# Patient Record
Sex: Female | Born: 1970 | Race: Black or African American | Hispanic: No | Marital: Married | Smoking: Former smoker
Health system: Southern US, Community
[De-identification: ages and names within clinical notes are randomized; demographics above are authoritative.]

## PROBLEM LIST (undated history)

## (undated) DIAGNOSIS — I10 Essential (primary) hypertension: Secondary | ICD-10-CM

## (undated) DIAGNOSIS — N979 Female infertility, unspecified: Secondary | ICD-10-CM

## (undated) DIAGNOSIS — N971 Female infertility of tubal origin: Secondary | ICD-10-CM

## (undated) DIAGNOSIS — R011 Cardiac murmur, unspecified: Secondary | ICD-10-CM

## (undated) HISTORY — DX: Female infertility of tubal origin: N97.1

## (undated) HISTORY — DX: Essential (primary) hypertension: I10

## (undated) HISTORY — DX: Cardiac murmur, unspecified: R01.1

## (undated) HISTORY — DX: Female infertility, unspecified: N97.9

---
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
## 1997-07-05 ENCOUNTER — Other Ambulatory Visit: Admission: RE | Admit: 1997-07-05 | Discharge: 1997-07-05 | Payer: Self-pay | Admitting: *Deleted

## 2000-01-14 ENCOUNTER — Other Ambulatory Visit: Admission: RE | Admit: 2000-01-14 | Discharge: 2000-01-14 | Payer: Self-pay | Admitting: Obstetrics and Gynecology

## 2000-02-10 ENCOUNTER — Ambulatory Visit (HOSPITAL_COMMUNITY): Admission: RE | Admit: 2000-02-10 | Discharge: 2000-02-10 | Payer: Self-pay | Admitting: Obstetrics and Gynecology

## 2000-02-10 ENCOUNTER — Encounter: Payer: Self-pay | Admitting: Obstetrics and Gynecology

## 2005-02-12 ENCOUNTER — Other Ambulatory Visit: Admission: RE | Admit: 2005-02-12 | Discharge: 2005-02-12 | Payer: Self-pay | Admitting: Gynecology

## 2007-12-13 ENCOUNTER — Ambulatory Visit: Payer: Self-pay | Admitting: Women's Health

## 2007-12-27 ENCOUNTER — Ambulatory Visit: Payer: Self-pay | Admitting: Women's Health

## 2007-12-28 ENCOUNTER — Ambulatory Visit: Payer: Self-pay | Admitting: Gynecology

## 2008-01-11 ENCOUNTER — Ambulatory Visit: Payer: Self-pay | Admitting: Gynecology

## 2008-01-16 ENCOUNTER — Encounter (INDEPENDENT_AMBULATORY_CARE_PROVIDER_SITE_OTHER): Payer: Self-pay | Admitting: Emergency Medicine

## 2008-01-16 ENCOUNTER — Emergency Department (HOSPITAL_COMMUNITY): Admission: EM | Admit: 2008-01-16 | Discharge: 2008-01-16 | Payer: Self-pay | Admitting: Emergency Medicine

## 2008-01-19 ENCOUNTER — Ambulatory Visit: Payer: Self-pay | Admitting: Gynecology

## 2008-01-26 ENCOUNTER — Ambulatory Visit: Payer: Self-pay | Admitting: Gynecology

## 2008-01-27 ENCOUNTER — Encounter: Payer: Self-pay | Admitting: Gynecology

## 2008-01-27 ENCOUNTER — Ambulatory Visit: Payer: Self-pay | Admitting: Gynecology

## 2008-01-27 ENCOUNTER — Ambulatory Visit (HOSPITAL_BASED_OUTPATIENT_CLINIC_OR_DEPARTMENT_OTHER): Admission: RE | Admit: 2008-01-27 | Discharge: 2008-01-27 | Payer: Self-pay | Admitting: Gynecology

## 2008-01-27 HISTORY — PX: DILATION AND EVACUATION: SHX1459

## 2008-02-03 ENCOUNTER — Ambulatory Visit: Payer: Self-pay | Admitting: Gynecology

## 2008-02-11 ENCOUNTER — Ambulatory Visit: Payer: Self-pay | Admitting: Gynecology

## 2008-02-22 ENCOUNTER — Ambulatory Visit: Payer: Self-pay | Admitting: Gynecology

## 2008-02-29 ENCOUNTER — Ambulatory Visit: Payer: Self-pay | Admitting: Gynecology

## 2008-02-29 ENCOUNTER — Other Ambulatory Visit: Admission: RE | Admit: 2008-02-29 | Discharge: 2008-02-29 | Payer: Self-pay | Admitting: Gynecology

## 2008-02-29 ENCOUNTER — Encounter: Payer: Self-pay | Admitting: Gynecology

## 2008-05-09 ENCOUNTER — Ambulatory Visit: Payer: Self-pay | Admitting: Gynecology

## 2008-05-18 ENCOUNTER — Ambulatory Visit: Payer: Self-pay | Admitting: Gynecology

## 2008-05-23 ENCOUNTER — Ambulatory Visit: Payer: Self-pay | Admitting: Gynecology

## 2008-06-16 ENCOUNTER — Ambulatory Visit: Payer: Self-pay | Admitting: Gynecology

## 2008-06-29 ENCOUNTER — Ambulatory Visit: Payer: Self-pay | Admitting: Gynecology

## 2009-11-21 ENCOUNTER — Ambulatory Visit: Payer: Self-pay | Admitting: Gynecology

## 2009-11-21 ENCOUNTER — Other Ambulatory Visit: Admission: RE | Admit: 2009-11-21 | Discharge: 2009-11-21 | Payer: Self-pay | Admitting: Gynecology

## 2009-12-14 ENCOUNTER — Ambulatory Visit: Payer: Self-pay | Admitting: Gynecology

## 2009-12-20 ENCOUNTER — Ambulatory Visit: Payer: Self-pay | Admitting: Gynecology

## 2010-07-09 NOTE — Op Note (Signed)
[name], [name]                ACCOUNT NO.:  [id number]   MEDICAL RECORD NO.:  [id number]          PATIENT TYPE:  AMB   LOCATION:  [facility]                         FACILITY:  [facility]   PHYSICIAN:  Juan H. Lily Peer, M.D.DATE OF BIRTH:  1970/12/12   DATE OF PROCEDURE:  01/27/2008  DATE OF DISCHARGE:                               OPERATIVE REPORT   INDICATIONS FOR OPERATION:  A 40 year old gravida 4, para 1, AB 2 with  incomplete AB and retained products of conception.  The patient had been  seen in the emergency room and has spontaneously passed where she felt  was the fetus and continued to bleed, presented to the office last week  and brought part of the specimen that she had passed in the toilet, and  was submitted for histological evaluation.  The report came back  hydropic chorionic villi identified features consistent with an  incomplete mole so the patient has been taken to the operating room for  complete evacuation of the uterine contents.   PREOPERATIVE DIAGNOSES:  1. Incomplete abortion.  2. Retained products of conception.  3. Incomplete mole.   POSTOPERATIVE DIAGNOSIS:  1. Incomplete abortion.  2. Retained products of conception.  3. Incomplete mole.   ANESTHESIA:  General endotracheal anesthesia.   PROCEDURE PERFORMED:  Dilatation and evacuation.   DESCRIPTION OF OPERATION:  After the patient was adequately counseled,  she was taken to the operating room where she underwent successful  general endotracheal anesthesia.  She had received a gram of cefoxitin  IV for prophylaxis.  She was placed in the low lithotomy position.  The  vagina and perineum were prepped and draped in usual sterile fashion.  A  laminaria that had been placed day before intracervically was removed.  The uterus sounded to approximately 10 cm, did not require any cervical  dilatation.  A 10 mm suction curette was introduced into the  intrauterine cavity after the single-tooth tenaculum was  placed on the  anterior cervical lip.  Prior to this, bladder had been evacuate with  contents with a [employer] Robinson for approximately 50 cc.  The  suction curette was introduced into the intrauterine cavity to remove  the remaining products of conception.  This was followed by a Hunter  curette to completely evacuate the uterine cavity, and the specimen was  submitted for histological  evaluation.  Pitocin 10 units were given in a liter of LR as a  uterotonic agent.  She clamped down very nicely.  She was extubated,  transferred to recovery room with stable vital signs after the single-  tooth tenaculum was removed.  Her blood type is A+.  Blood loss from  procedure was minimal.  IV fluids consisted 700 cc of lactated Ringer's.      Juan H. Lily Peer, M.D.  Electronically Signed     JHF/MEDQ  D:  01/27/2008  T:  01/28/2008  Job:  [id number]

## 2010-07-09 NOTE — H&P (Signed)
[name], [name]                ACCOUNT NO.:  [id number]   MEDICAL RECORD NO.:  [id number]          PATIENT TYPE:  AMB   LOCATION:  [facility]                         FACILITY:  [facility]   PHYSICIAN:  [name], M.D.DATE OF BIRTH:  01-09-1971   DATE OF ADMISSION:  DATE OF DISCHARGE:                              HISTORY & PHYSICAL   The patient was scheduled for surgery on Thursday, December 3 at 1 p.m.  at [facility].  Please have his history and physical  available.   CHIEF COMPLAINT:  1. Retained products of conception.  2. Incomplete mole.   HISTORY:  The patient is a 40 year old gravida 4, para 1 AB 2 who had  been seen in the office on November 2 where she was noted to have a  viable intrauterine pregnancy consistent with 7 weeks' gestation.  She  had had some issues with passing some blood clots which essentially  resolved, but she was tested and her blood type was A+.  She is given  threatened AB precautions.  She returned back on November 17, fetal  viability was noted and size was consistent with dates at approximately  9 weeks' gestation.  She returned back to the office on November 25  stating that she had been to Emergency Room at [facility] on  Sunday, November 22.  She was complaining of bleeding and cramping and  while waiting in the exam room, she had passed what she describes as the  fetus and was asked to follow up in the office which she did on November  25.  We tried to obtain records from [facility] and reviewed the  notes and there was description of the fetus, but pathology report, they  received no specimen.  When the patient saw me on November 25, she had  in a plastic bag what appeared to be placental like tissue and was  submitted for histological evaluation, and the report came back hydropic  chorionic villi identified.  The features may be seen an _incomplete  mole_________and in her case, there was a viable fetus before  confirmed  by ultrasound.  This would be consistent with an incomplete mole.  She  returned back to the office today for a followup ultrasound to see if  the tissue had passed and to discuss this pathology report.  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  Her quantitative beta HCG today was 558.  Her TSH was normal.  Her  urinalysis; rare wbc; 1+ bacteria.  Her CBC had a hemoglobin 11.9,  hematocrit 37.3 and platelet count 523,000.  The patient had a laminaria  placed intracervically today and _is_________scheduled to undergo D&E  tomorrow, December 3.   PAST MEDICAL HISTORY:  The patient denies any allergy.  She has had 2  elective terminations in the past.  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  She has  history of murmurs, which sounds to me like she is having history of  mitral valve prolapse, was instructed always to receive antibiotics  which she will receive with this surgical procedure.  She was just on  multivitamin and had a cholecystectomy in 1992.   FAMILY HISTORY:  Maternal grandmother with breast cancer.   PHYSICAL EXAMINATION:  GENERAL:  The patient weighs approximately 246  pounds.  HEENT:  Unremarkable.  NECK: Supple.  Trachea midline.  No carotid bruits or thyromegaly.  LUNGS:  Clear to auscultation without any rhonchi or wheezes.  HEART:  Regular rate and rhythm.  No murmurs or gallop.  BREAST:  Exam not done.  ABDOMEN:  Soft and nontender.  No rebound or guarding.  PELVIC:  No vaginal discharge or blood was noted in the vault.  Uterus  approximately 6 to 7 weeks' size.  No palpable adnexal masses.  Laminaria was in placed intracervically to facilitate during the  D&E,  possible resectoscopic procedure tomorrow.   ASSESSMENT:  The patient is a 40 year old gravida 4, para 1, AB 2, now  AB 3 with incomplete AB and products of conception that were passed with  significant hydropic chorionic villi, ultrasound-confirmed retained  products of conception.  The patient will be taken to the operating room  tomorrow at [facility] to undergo a dilation and  evacuation.  The laminaria was placed intracervically.  The risks,  benefits and pros and cons of the procedure were discussed with the  patient to include infection although she will receive prophylaxis  antibiotic as well because of her history of mitral valve prolapse, also  the risk of perforation, hemorrhage in the event that she would need  blood or blood transfusion.  She is fully aware of the potential risk of  anaphylactic reactions, hepatitis, and AIDS.  All these issues were  discussed with the patient and will follow her quantitative beta HCGs  postop and I have given her literature information on molar pregnancy  for her to read as well and her blood type was A+.   PLAN:  As per assessment above.      [name], M.D.  Electronically Signed     JHF/MEDQ  D:  01/26/2008  T:  01/26/2008  Job:  [id number]

## 2010-11-27 LAB — URINALYSIS, ROUTINE W REFLEX MICROSCOPIC
Glucose, UA: NEGATIVE
Hgb urine dipstick: NEGATIVE
Ketones, ur: 40 — AB
Protein, ur: NEGATIVE
pH: 5.5

## 2010-11-27 LAB — CBC
HCT: 38.4
Hemoglobin: 12.6
MCHC: 32.9
MCV: 76.3 — ABNORMAL LOW
Platelets: 320
RBC: 5.03
RDW: 16.8 — ABNORMAL HIGH
WBC: 8.9

## 2010-11-27 LAB — HCG, QUANTITATIVE, PREGNANCY: hCG, Beta Chain, Quant, S: 133438 — ABNORMAL HIGH

## 2011-03-27 ENCOUNTER — Other Ambulatory Visit (HOSPITAL_COMMUNITY)
Admission: RE | Admit: 2011-03-27 | Discharge: 2011-03-27 | Disposition: A | Discharge status: Home / Self Care | Payer: BC Managed Care – PPO | Source: Ambulatory Visit | Attending: Gynecology | Admitting: Gynecology

## 2011-03-27 ENCOUNTER — Encounter: Payer: Self-pay | Admitting: Gynecology

## 2011-03-27 ENCOUNTER — Ambulatory Visit (INDEPENDENT_AMBULATORY_CARE_PROVIDER_SITE_OTHER): Payer: BC Managed Care – PPO | Admitting: Gynecology

## 2011-03-27 DIAGNOSIS — I10 Essential (primary) hypertension: Secondary | ICD-10-CM | POA: Insufficient documentation

## 2011-03-27 DIAGNOSIS — B9689 Other specified bacterial agents as the cause of diseases classified elsewhere: Secondary | ICD-10-CM

## 2011-03-27 DIAGNOSIS — N760 Acute vaginitis: Secondary | ICD-10-CM

## 2011-03-27 DIAGNOSIS — Z113 Encounter for screening for infections with a predominantly sexual mode of transmission: Secondary | ICD-10-CM

## 2011-03-27 DIAGNOSIS — E663 Overweight: Secondary | ICD-10-CM

## 2011-03-27 DIAGNOSIS — A499 Bacterial infection, unspecified: Secondary | ICD-10-CM

## 2011-03-27 DIAGNOSIS — Z01419 Encounter for gynecological examination (general) (routine) without abnormal findings: Secondary | ICD-10-CM | POA: Insufficient documentation

## 2011-03-27 DIAGNOSIS — Z833 Family history of diabetes mellitus: Secondary | ICD-10-CM

## 2011-03-27 LAB — CBC WITH DIFFERENTIAL/PLATELET
Eosinophils Absolute: 0.1 10*3/uL (ref 0.0–0.7)
Eosinophils Relative: 1 % (ref 0–5)
HCT: 38.8 % (ref 36.0–46.0)
Hemoglobin: 12.5 g/dL (ref 12.0–15.0)
Lymphocytes Relative: 33 % (ref 12–46)
Lymphs Abs: 2.8 10*3/uL (ref 0.7–4.0)
MCH: 24.2 pg — ABNORMAL LOW (ref 26.0–34.0)
MCV: 75 fL — ABNORMAL LOW (ref 78.0–100.0)
Monocytes Absolute: 0.6 10*3/uL (ref 0.1–1.0)
Monocytes Relative: 7 % (ref 3–12)
RBC: 5.17 MIL/uL — ABNORMAL HIGH (ref 3.87–5.11)
WBC: 8.4 10*3/uL (ref 4.0–10.5)

## 2011-03-27 LAB — WET PREP FOR TRICH, YEAST, CLUE
Trich, Wet Prep: NONE SEEN
WBC, Wet Prep HPF POC: NONE SEEN
Yeast Wet Prep HPF POC: NONE SEEN

## 2011-03-27 LAB — URINALYSIS W MICROSCOPIC + REFLEX CULTURE
Bilirubin Urine: NEGATIVE
Glucose, UA: NEGATIVE mg/dL
Hgb urine dipstick: NEGATIVE
Ketones, ur: NEGATIVE mg/dL
Leukocytes, UA: NEGATIVE
Nitrite: NEGATIVE
Protein, ur: NEGATIVE mg/dL
Specific Gravity, Urine: 1.025 (ref 1.005–1.030)
Urobilinogen, UA: 1 mg/dL (ref 0.0–1.0)
pH: 5.5 (ref 5.0–8.0)

## 2011-03-27 LAB — HEPATITIS B SURFACE ANTIGEN: Hepatitis B Surface Ag: NEGATIVE

## 2011-03-27 LAB — HEPATITIS C ANTIBODY: HCV Ab: NEGATIVE

## 2011-03-27 LAB — CHOLESTEROL, TOTAL: Cholesterol: 169 mg/dL (ref 0–200)

## 2011-03-27 LAB — HIV ANTIBODY (ROUTINE TESTING W REFLEX): HIV: NONREACTIVE

## 2011-03-27 MED ORDER — METRONIDAZOLE 500 MG PO TABS: 500.0000 mg | ORAL_TABLET | Freq: Two times a day (BID) | ORAL | Status: AC

## 2011-03-27 NOTE — Patient Instructions (Addendum)
Please remember to get a blood pressure reading over the course of the next 10 days and fax it to me to look at. Cut down on salt intake. The following are some tips on diet and exercise.                                                   Cholesterol Control Diet  Cholesterol levels in your body are determined significantly by your diet. Cholesterol levels may also be related to heart disease. The following material helps to explain this relationship and discusses what you can do to help keep your heart healthy. Not all cholesterol is bad. Low-density lipoprotein (LDL) cholesterol is the "bad" cholesterol. It may cause fatty deposits to build up inside your arteries. High-density lipoprotein (HDL) cholesterol is "good." It helps to remove the "bad" LDL cholesterol from your blood. Cholesterol is a very important risk factor for heart disease. Other risk factors are high blood pressure, smoking, stress, heredity, and weight. The heart muscle gets its supply of blood through the coronary arteries. If your LDL cholesterol is high and your HDL cholesterol is low, you are at risk for having fatty deposits build up in your coronary arteries. This leaves less room through which blood can flow. Without sufficient blood and oxygen, the heart muscle cannot function properly and you may feel chest pains (angina pectoris). When a coronary artery closes up entirely, a part of the heart muscle may die, causing a heart attack (myocardial infarction). CHECKING CHOLESTEROL When your caregiver sends your blood to a lab to be analyzed for cholesterol, a complete lipid (fat) profile may be done. With this test, the total amount of cholesterol and levels of LDL and HDL are determined. Triglycerides are a type of fat that circulates in the blood and can also be used to determine heart disease risk. The list below describes what the numbers should be: Test: Total Cholesterol.  Less than 200 mg/dl.  Test: LDL "bad  cholesterol."  Less than 100 mg/dl.   Less than 70 mg/dl if you are at very high risk of a heart attack or sudden cardiac death.  Test: HDL "good cholesterol."  Greater than 50 mg/dl for women.   Greater than 40 mg/dl for men.  Test: Triglycerides.  Less than 150 mg/dl.  CONTROLLING CHOLESTEROL WITH DIET Although exercise and lifestyle factors are important, your diet is key. That is because certain foods are known to raise cholesterol and others to lower it. The goal is to balance foods for their effect on cholesterol and more importantly, to replace saturated and trans fat with other types of fat, such as monounsaturated fat, polyunsaturated fat, and omega-3 fatty acids. On average, a person should consume no more than 15 to 17 g of saturated fat daily. Saturated and trans fats are considered "bad" fats, and they will raise LDL cholesterol. Saturated fats are primarily found in animal products such as meats, butter, and cream. However, that does not mean you need to sacrifice all your favorite foods. Today, there are good tasting, low-fat, low-cholesterol substitutes for most of the things you like to eat. Choose low-fat or nonfat alternatives. Choose round or loin cuts of red meat, since these types of cuts are lowest in fat and cholesterol. Chicken (without the skin), fish, veal, and ground [place] breast are excellent choices. Eliminate fatty  meats, such as hot dogs and salami. Even shellfish have little or no saturated fat. Have a 3 oz (85 g) portion when you eat lean meat, poultry, or fish. Trans fats are also called "partially hydrogenated oils." They are oils that have been scientifically manipulated so that they are solid at room temperature resulting in a longer shelf life and improved taste and texture of foods in which they are added. Trans fats are found in stick margarine, some tub margarines, cookies, crackers, and baked goods.  When baking and cooking, oils are an excellent  substitute for butter. The monounsaturated oils are especially beneficial since it is believed they lower LDL and raise HDL. The oils you should avoid entirely are saturated tropical oils, such as coconut and palm.  Remember to eat liberally from food groups that are naturally free of saturated and trans fat, including fish, fruit, vegetables, beans, grains (barley, rice, couscous, bulgur wheat), and pasta (without cream sauces).  IDENTIFYING FOODS THAT LOWER CHOLESTEROL  Soluble fiber may lower your cholesterol. This type of fiber is found in fruits such as apples, vegetables such as broccoli, potatoes, and carrots, legumes such as beans, peas, and lentils, and grains such as barley. Foods fortified with plant sterols (phytosterol) may also lower cholesterol. You should eat at least 2 g per day of these foods for a cholesterol lowering effect.  Read package labels to identify low-saturated fats, trans fats free, and low-fat foods at the supermarket. Select cheeses that have only 2 to 3 g saturated fat per ounce. Use a heart-healthy tub margarine that is free of trans fats or partially hydrogenated oil. When buying baked goods (cookies, crackers), avoid partially hydrogenated oils. Breads and muffins should be made from whole grains (whole-wheat or whole oat flour, instead of "flour" or "enriched flour"). Buy non-creamy canned soups with reduced salt and no added fats.  FOOD PREPARATION TECHNIQUES  Never deep-fry. If you must fry, either stir-fry, which uses very little fat, or use non-stick cooking sprays. When possible, broil, bake, or roast meats, and steam vegetables. Instead of dressing vegetables with butter or margarine, use lemon and herbs, applesauce and cinnamon (for squash and sweet potatoes), nonfat yogurt, salsa, and low-fat dressings for salads.  LOW-SATURATED FAT / LOW-FAT FOOD SUBSTITUTES Meats / Saturated Fat (g)  Avoid: Steak, marbled (3 oz/85 g) / 11 g   Choose: Steak, lean (3 oz/85 g)  / 4 g   Avoid: Hamburger (3 oz/85 g) / 7 g   Choose: Hamburger, lean (3 oz/85 g) / 5 g   Avoid: Ham (3 oz/85 g) / 6 g   Choose: Ham, lean cut (3 oz/85 g) / 2.4 g   Avoid: Chicken, with skin, dark meat (3 oz/85 g) / 4 g   Choose: Chicken, skin removed, dark meat (3 oz/85 g) / 2 g   Avoid: Chicken, with skin, light meat (3 oz/85 g) / 2.5 g   Choose: Chicken, skin removed, light meat (3 oz/85 g) / 1 g  Dairy / Saturated Fat (g)  Avoid: Whole milk (1 cup) / 5 g   Choose: Low-fat milk, 2% (1 cup) / 3 g   Choose: Low-fat milk, 1% (1 cup) / 1.5 g   Choose: Skim milk (1 cup) / 0.3 g   Avoid: Hard cheese (1 oz/28 g) / 6 g   Choose: Skim milk cheese (1 oz/28 g) / 2 to 3 g   Avoid: Cottage cheese, 4% fat (1 cup) / 6.5 g  Choose: Low-fat cottage cheese, 1% fat (1 cup) / 1.5 g   Avoid: Ice cream (1 cup) / 9 g   Choose: Sherbet (1 cup) / 2.5 g   Choose: Nonfat frozen yogurt (1 cup) / 0.3 g   Choose: Frozen fruit bar / trace   Avoid: Whipped cream (1 tbs) / 3.5 g   Choose: Nondairy whipped topping (1 tbs) / 1 g  Condiments / Saturated Fat (g)  Avoid: Mayonnaise (1 tbs) / 2 g   Choose: Low-fat mayonnaise (1 tbs) / 1 g   Avoid: Butter (1 tbs) / 7 g   Choose: Extra light margarine (1 tbs) / 1 g   Avoid: Coconut oil (1 tbs) / 11.8 g   Choose: Olive oil (1 tbs) / 1.8 g   Choose: Corn oil (1 tbs) / 1.7 g   Choose: Safflower oil (1 tbs) / 1.2 g   Choose: Sunflower oil (1 tbs) / 1.4 g   Choose: Soybean oil (1 tbs) / 2.4 g   Choose: Canola oil (1 tbs) / 1 g  Document Released: 02/10/2005 Document Revised: 10/23/2010 Document Reviewed: 08/01/2010 [facility] Patient Information 2012 [place], [place].  Exercise to Lose Weight Exercise and a healthy diet may help you lose weight. Your doctor may suggest specific exercises. EXERCISE IDEAS AND TIPS  Choose low-cost things you enjoy doing, such as walking, bicycling, or exercising to workout videos.   Take stairs instead  of the elevator.   Walk during your lunch break.   Park your car further away from work or school.   Go to a gym or an exercise class.   Start with 5 to 10 minutes of exercise each day. Build up to 30 minutes of exercise 4 to 6 days a week.   Wear shoes with good support and comfortable clothes.   Stretch before and after working out.   Work out until you breathe harder and your heart beats faster.   Drink extra water when you exercise.   Do not do so much that you hurt yourself, feel dizzy, or get very short of breath.  Exercises that burn about 150 calories:  Running 1  miles in 15 minutes.   Playing volleyball for 45 to 60 minutes.   Washing and waxing a car for 45 to 60 minutes.   Playing touch football for 45 minutes.   Walking 1  miles in 35 minutes.   Pushing a stroller 1  miles in 30 minutes.   Playing basketball for 30 minutes.   Raking leaves for 30 minutes.   Bicycling 5 miles in 30 minutes.   Walking 2 miles in 30 minutes.   Dancing for 30 minutes.   Shoveling snow for 15 minutes.   Swimming laps for 20 minutes.   Walking up stairs for 15 minutes.   Bicycling 4 miles in 15 minutes.   Gardening for 30 to 45 minutes.   Jumping rope for 15 minutes.   Washing windows or floors for 45 to 60 minutes.  Document Released: 03/15/2010 Document Revised: 10/23/2010 Document Reviewed: 03/15/2010 [facility] Patient Information 2012 [place], [place].     Bacterial Vaginosis Bacterial vaginosis (BV) is a vaginal infection where the normal balance of bacteria in the vagina is disrupted. The normal balance is then replaced by an overgrowth of certain bacteria. There are several different kinds of bacteria that can cause BV. BV is the most common vaginal infection in women of childbearing age. CAUSES   The cause of BV is  not fully understood. BV develops when there is an increase or imbalance of harmful bacteria.   Some activities or behaviors can upset the  normal balance of bacteria in the vagina and put women at increased risk including:   Having a new sex partner or multiple sex partners.   Douching.   Using an intrauterine device (IUD) for contraception.   It is not clear what role sexual activity plays in the development of BV. However, women that have never had sexual intercourse are rarely infected with BV.  Women do not get BV from toilet seats, bedding, swimming pools or from touching objects around them.  SYMPTOMS   Grey vaginal discharge.   A fish-like odor with discharge, especially after sexual intercourse.   Itching or burning of the vagina and vulva.   Burning or pain with urination.   Some women have no signs or symptoms at all.  DIAGNOSIS  Your caregiver must examine the vagina for signs of BV. Your caregiver will perform lab tests and look at the sample of vaginal fluid through a microscope. They will look for bacteria and abnormal cells (clue cells), a pH test higher than 4.5, and a positive amine test all associated with BV.  RISKS AND COMPLICATIONS   Pelvic inflammatory disease (PID).   Infections following gynecology surgery.   Developing HIV.   Developing herpes virus.  TREATMENT  Sometimes BV will clear up without treatment. However, all women with symptoms of BV should be treated to avoid complications, especially if gynecology surgery is planned. Female partners generally do not need to be treated. However, BV may spread between female sex partners so treatment is helpful in preventing a recurrence of BV.   BV may be treated with antibiotics. The antibiotics come in either pill or vaginal cream forms. Either can be used with nonpregnant or pregnant women, but the recommended dosages differ. These antibiotics are not harmful to the baby.   BV can recur after treatment. If this happens, a second round of antibiotics will often be prescribed.   Treatment is important for pregnant women. If not treated, BV can  cause a premature delivery, especially for a pregnant woman who had a premature birth in the past. All pregnant women who have symptoms of BV should be checked and treated.   For chronic reoccurrence of BV, treatment with a type of prescribed gel vaginally twice a week is helpful.  HOME CARE INSTRUCTIONS   Finish all medication as directed by your caregiver.   Do not have sex until treatment is completed.   Tell your sexual partner that you have a vaginal infection. They should see their caregiver and be treated if they have problems, such as a mild rash or itching.   Practice safe sex. Use condoms. Only have 1 sex partner.  PREVENTION  Basic prevention steps can help reduce the risk of upsetting the natural balance of bacteria in the vagina and developing BV:  Do not have sexual intercourse (be abstinent).   Do not douche.   Use all of the medicine prescribed for treatment of BV, even if the signs and symptoms go away.   Tell your sex partner if you have BV. That way, they can be treated, if needed, to prevent reoccurrence.  SEEK MEDICAL CARE IF:   Your symptoms are not improving after 3 days of treatment.   You have increased discharge, pain, or fever.  MAKE SURE YOU:   Understand these instructions.   Will watch your condition.  Will get help right away if you are not doing well or get worse.  FOR MORE INFORMATION  Division of STD Prevention (DSTDP), Centers for Disease Control and Prevention: [employer] American Social Health Association (ASHA): www.ashastd.org  Document Released: 02/10/2005 Document Revised: 10/23/2010 Document Reviewed: 08/03/2008 [facility] Patient Information 2012 [place], [place].

## 2011-03-27 NOTE — Progress Notes (Signed)
[name] May 26, 1970 [id number]   History:    41 y.o.  for annual exam and has not been seen the office since 2011. Patient interested in getting an STD screen. Review of her record indicated she had history of hypertension the past and had been on HCTZ 25 mg daily and is no longer on it. Today's blood pressure 132/88. Patient has not been sexually active in 2 months. She states her cycles are regular every 28 days and not using any form of contraception. Review of her record and indicated that in 2011 her hemoglobin A1c was elevated and a followup fasting blood sugar was in the normal range. She is overweight with a BMI of 46.17. Patient denies any prior history of abnormal Pap smears  Past medical history,surgical history, family history and social history were all reviewed and documented in the EPIC chart.  Gynecologic History Patient's last menstrual period was 03/12/2011. Contraception: none Last Pap: 2011. Results were: normal Last mammogram: No prior study. Results were: No prior study  Obstetric History OB History    Grav Para Term Preterm Abortions TAB SAB Ect Mult Living   2 1 1  1  1   1      # Outc Date GA Lbr Len/2nd Wgt Sex Del Anes PTL Lv   1 SAB            2 TRM      SVD  No Yes       ROS:  Was performed and pertinent positives and negatives are included in the history.  Exam: chaperone present  BP 132/88  Ht 5\' 4"  (1.626 m)  Wt 269 lb (122.018 kg)  BMI 46.17 kg/m2  LMP 03/12/2011  Body mass index is 46.17 kg/(m^2).  General appearance : Well developed well nourished female. No acute distress HEENT: Neck supple, trachea midline, no carotid bruits, no thyroidmegaly Lungs: Clear to auscultation, no rhonchi or wheezes, or rib retractions  Heart: Regular rate and rhythm, no murmurs or gallops Breast:Examined in sitting and supine position were symmetrical in appearance, no palpable masses or tenderness,  no skin retraction, no nipple inversion, no nipple  discharge, no skin discoloration, no axillary or supraclavicular lymphadenopathy Abdomen: no palpable masses or tenderness, no rebound or guarding Extremities: no edema or skin discoloration or tenderness  Pelvic:  Bartholin, Urethra, Skene Glands: Within normal limits             Vagina: No gross lesions or discharge  Cervix: No gross lesions or discharge  Uterus  anteverted, normal size, shape and consistency, non-tender and mobile  Adnexa  Without masses or tenderness  Anus and perineum  normal   Rectovaginal  normal sphincter tone without palpated masses or tenderness             Hemoccult not done     Assessment/Plan:  41 y.o. female for annual exam unremarkable. Patient requesting an STD screen. Wet prep demonstrated clue cells and she'll be treated for her BV with Flagyl 500 mg one by mouth twice a day for 5 days. GC and Chlamydia culture as well as a hepatitis B and C. HIV and RPR obtained results pending at time of this dictation. Pap smear was done today. Screening guidelines discussed. Pap smear will be done today the next will be in 3 years. Literature information on weight reduction diet and exercise was provided. She was instructed to do her monthly self breast examination. We'll see her back in one year or when necessary.  Ok Edwards MD, 12:28 PM 03/27/2011

## 2011-03-28 LAB — RPR

## 2011-03-28 LAB — GC/CHLAMYDIA PROBE AMP, GENITAL: Chlamydia, DNA Probe: NEGATIVE

## 2011-07-24 ENCOUNTER — Institutional Professional Consult (permissible substitution): Payer: BC Managed Care – PPO | Admitting: Gynecology

## 2011-08-26 ENCOUNTER — Ambulatory Visit (INDEPENDENT_AMBULATORY_CARE_PROVIDER_SITE_OTHER): Payer: BC Managed Care – PPO | Admitting: Physician Assistant

## 2011-08-26 ENCOUNTER — Encounter: Payer: Self-pay | Admitting: Gynecology

## 2011-08-26 ENCOUNTER — Ambulatory Visit (INDEPENDENT_AMBULATORY_CARE_PROVIDER_SITE_OTHER): Payer: BC Managed Care – PPO | Admitting: Gynecology

## 2011-08-26 VITALS — BP 130/90

## 2011-08-26 VITALS — BP 134/92 | HR 71 | Temp 98.5°F | Resp 18 | Ht 65.75 in | Wt 291.0 lb

## 2011-08-26 DIAGNOSIS — E669 Obesity, unspecified: Secondary | ICD-10-CM

## 2011-08-26 DIAGNOSIS — N979 Female infertility, unspecified: Secondary | ICD-10-CM

## 2011-08-26 DIAGNOSIS — I10 Essential (primary) hypertension: Secondary | ICD-10-CM

## 2011-08-26 DIAGNOSIS — Z0000 Encounter for general adult medical examination without abnormal findings: Secondary | ICD-10-CM

## 2011-08-26 DIAGNOSIS — Z111 Encounter for screening for respiratory tuberculosis: Secondary | ICD-10-CM

## 2011-08-26 LAB — POCT URINALYSIS DIPSTICK
Ketones, UA: NEGATIVE
Protein, UA: NEGATIVE
Spec Grav, UA: 1.02
pH, UA: 6

## 2011-08-26 LAB — POCT UA - MICROSCOPIC ONLY
Casts, Ur, LPF, POC: NEGATIVE
Crystals, Ur, HPF, POC: NEGATIVE

## 2011-08-26 LAB — COMPREHENSIVE METABOLIC PANEL
CO2: 25 mEq/L (ref 19–32)
Creat: 0.91 mg/dL (ref 0.50–1.10)
Glucose, Bld: 96 mg/dL (ref 70–99)
Total Bilirubin: 0.3 mg/dL (ref 0.3–1.2)

## 2011-08-26 LAB — POCT CBC
Granulocyte percent: 56.9 %G (ref 37–80)
HCT, POC: 41.4 % (ref 37.7–47.9)
MCH, POC: 23.6 pg — AB (ref 27–31.2)
MCV: 77.6 fL — AB (ref 80–97)
POC LYMPH PERCENT: 38.3 %L (ref 10–50)
RDW, POC: 16.6 %
WBC: 7.2 10*3/uL (ref 4.6–10.2)

## 2011-08-26 LAB — LIPID PANEL
Cholesterol: 183 mg/dL (ref 0–200)
HDL: 41 mg/dL (ref >39)
Total CHOL/HDL Ratio: 4.5 Ratio
Triglycerides: 71 mg/dL (ref <150)
VLDL: 14 mg/dL (ref 0–40)

## 2011-08-26 MED ORDER — DOXYCYCLINE HYCLATE 50 MG PO CAPS: 100.0000 mg | ORAL_CAPSULE | Freq: Two times a day (BID) | ORAL | Status: AC

## 2011-08-26 MED ORDER — HYDROCHLOROTHIAZIDE 12.5 MG PO CAPS: 12.5000 mg | ORAL_CAPSULE | ORAL | Status: DC

## 2011-08-26 NOTE — Patient Instructions (Addendum)
Infertility WHAT IS INFERTILITY?  Infertility is usually defined as not being able to get pregnant after trying for one year of regular sexual intercourse without the use of contraceptives. Or not being able to carry a pregnancy to term and have a baby. The infertility rate in the [place] is around 10%. Pregnancy is the result of a chain of events. A woman must release an egg from one of her ovaries (ovulation). The egg must be fertilized by the female sperm. Then it travels through a fallopian tube into the uterus (womb), where it attaches to the wall of the uterus and grows. A man must have enough sperm, and the sperm must join with (fertilize) the egg along the way, at the proper time. The fertilized egg must then become attached to the inside of the uterus. While this may seem simple, many things can happen to prevent pregnancy from occurring.  WHOSE PROBLEM IS IT?  About 20% of infertility cases are due to problems with the man (female factors) and 65% are due to problems with the woman (female factors). Other cases are due to a combination of female and female factors or to unknown causes.  WHAT CAUSES INFERTILITY IN MEN?  Infertility in men is often caused by problems with making enough normal sperm or getting the sperm to reach the egg. Problems with sperm may exist from birth or develop later in life, due to illness or injury. Some men produce no sperm, or produce too few sperm (oligospermia). Other problems include:  Sexual dysfunction.   Hormonal or endocrine problems.   Age. Female fertility decreases with age, but not at as young an age as female fertility.   Infection.   Congenital problems. Birth defect, such as absence of the tubes that carry the sperm (vas deferens).   Genetic/chromosomal problems.   Antisperm antibody problems.   Retrograde ejaculation (sperm go into the bladder).   Varicoceles, spematoceles, or tumors of the testicles.   Lifestyle can influence the number  and quality of a man's sperm.   Alcohol and drugs can temporarily reduce sperm quality.   Environmental toxins, including pesticides and lead, may cause some cases of infertility in men.  WHAT CAUSES INFERTILITY IN WOMEN?   Problems with ovulation account for most infertility in women. Without ovulation, eggs are not available to be fertilized.   Signs of problems with ovulation include irregular menstrual periods or no periods at all.   Simple lifestyle factors, including stress, diet, or athletic training, can affect a woman's hormonal balance.   Age. Fertility begins to decrease in women in the early 30s and is worse after age 37.   Much less often, a hormonal imbalance from a serious medical problem, such as a pituitary gland tumor, thyroid or other chronic medical disease, can cause ovulation problems.   Pelvic infections.   Polycystic ovary syndrome (increase in female hormones, unable to ovulate).   Alcohol or illegal drugs.   Environmental toxins, radiation, pesticides, and certain chemicals.   Aging is an important factor in female infertility.   The ability of a woman's ovaries to produce eggs declines with age, especially after age 35. About one third of couples where the woman is over 35 will have problems with fertility.   By the time she reaches menopause when her monthly periods stop for good, a woman can no longer produce eggs or become pregnant.   Other problems can also lead to infertility in women. If the fallopian tubes are blocked   at one or both ends, the egg cannot travel through the tubes into the uterus. Scar tissue (adhesions) in the pelvis may cause blocked tubes. This may result from pelvic inflammatory disease, endometriosis, or surgery for an ectopic pregnancy (fertilized egg implanted outside the uterus) or any pelvic or abdominal surgery causing adhesions.   Fibroid tumors or polyps of the uterus.   Congenital (birth defect) abnormalities of the uterus.    Infection of the cervix (cervicitis).   Cervical stenosis (narrowing).   Abnormal cervical mucus.   Polycystic ovary syndrome.   Having sexual intercourse too often (every other day or 4 to 5 times a week).   Obesity.   Anorexia.   Poor nutrition.   Over exercising, with loss of body fat.   DES. Your mother received diethylstilbesterol hormone when pregnant with you.  HOW IS INFERTILITY TESTED?  If you have been trying to have a baby without success, you may want to seek medical help. You should not wait for one year of trying before seeing a health care provider if:  You are over 35.   You have reason to believe that there may be a fertility problem.  A medical evaluation may determine the reasons for a couple's infertility. Usually this process begins with:  Physical exams.   Medical histories of both partners.   Sexual histories of both partners.  If there is no obvious problem, like improperly timed intercourse or absence of ovulation, tests may be needed.   For a man, testing usually begins with tests of his semen to look at:   The number of sperm.   The shape of sperm.   Movement of his sperm.   Taking a complete medical and surgical history.   Physical examination.   Check for infection of the female reproductive organs.  Sometimes hormone tests are done.   For a woman, the first step in testing is to find out if she is ovulating each month. There are several ways to do this. For example, she can keep track of changes in her morning body temperature and in the texture of her cervical mucus. Another tool is a home ovulation test kit, which can be bought at drug or grocery stores.   Checks of ovulation can also be done in the doctor's office, using blood tests for hormone levels or ultrasound tests of the ovaries. If the woman is ovulating, more tests will need to be done. Some common female tests include:   Hysterosalpingogram: An x-ray of the fallopian  tubes and uterus after they are injected with dye. It shows if the tubes are open and shows the shape of the uterus.   Laparoscopy: An exam of the tubes and other female organs for disease. A lighted tube called a laparoscope is used to see inside the abdomen.   Endometrial biopsy: Sample of uterus tissue taken on the first day of the menstrual period, to see if the tissue indicates you are ovulating.   Transvaginal ultrasound: Examines the female organs.   Hysteroscopy: Uses a lighted tube to examine the cervix and inside the uterus, to see if there are any abnormalities inside the uterus.  TREATMENT  Depending on the test results, different treatments can be suggested. The type of treatment depends on the cause. 85 to 90% of infertility cases are treated with drugs or surgery.   Various fertility drugs may be used for women with ovulation problems. It is important to talk with your caregiver about the drug to   be used. You should understand the drug's benefits and side effects. Depending on the type of fertility drug and the dosage of the drug used, multiple births (twins or multiples) can occur in some women.   If needed, surgery can be done to repair damage to a woman's ovaries, fallopian tubes, cervix, or uterus.   Surgery or medical treatment for endometriosis or polycystic ovary syndrome. Sometimes a man has an infertility problem that can be corrected with medicine or by surgery.   Intrauterine insemination (IUI) of sperm, timed with ovulation.   Change in lifestyle, if that is the cause (lose weight, increase exercise, and stop smoking, drinking excessively, or taking illegal drugs).   Other types of surgery:   Removing growths inside and on the uterus.   Removing scar tissue from inside of the uterus.   Fixing blocked tubes.   Removing scar tissue in the pelvis and around the female organs.  WHAT IS ASSISTED REPRODUCTIVE TECHNOLOGY (ART)?  Assisted reproductive technology  (ART) is another form of special methods used to help infertile couples. ART involves handling both the woman's eggs and the man's sperm. Success rates vary and depend on many factors. ART can be expensive and time-consuming. But ART has made it possible for many couples to have children that otherwise would not have been conceived. Some methods are listed below:  In vitro fertilization (IVF). IVF is often used when a woman's fallopian tubes are blocked or when a man has low sperm counts. A drug is used to stimulate the ovaries to produce multiple eggs. Once mature, the eggs are removed and placed in a culture dish with the man's sperm for fertilization. After about 40 hours, the eggs are examined to see if they have become fertilized by the sperm and are dividing into cells. These fertilized eggs (embryos) are then placed in the woman's uterus. This bypasses the fallopian tubes.   Gamete intrafallopian transfer (GIFT) is similar to IVF, but used when the woman has at least one normal fallopian tube. Three to five eggs are placed in the fallopian tube, along with the man's sperm, for fertilization inside the woman's body.   Zygote intrafallopian transfer (ZIFT), also called tubal embryo transfer, combines IVF and GIFT. The eggs retrieved from the woman's ovaries are fertilized in the lab and placed in the fallopian tubes rather than in the uterus.   ART procedures sometimes involve the use of donor eggs (eggs from another woman) or previously frozen embryos. Donor eggs may be used if a woman has impaired ovaries or has a genetic disease that could be passed on to her baby.   When performing ART, you are at higher risk for resulting in multiple pregnancies, twins, triplets or more.   Intracytoplasma sperm injection is a procedure that injects a single sperm into the egg to fertilize it.   Embryo transplant is a procedure that starts after growing an embryo in a special media (chemical solution)  developed to keep the embryo alive for 2 to 5 days, and then transplanting it into the uterus.  In cases where a cause cannot be found and pregnancy does not occur, adoption may be a consideration. Document Released: 02/13/2003 Document Revised: 01/30/2011 Document Reviewed: 01/09/2009 [facility] Patient Information 2012 [place], [place].  Exercise to Lose Weight Exercise and a healthy diet may help you lose weight. Your doctor may suggest specific exercises. EXERCISE IDEAS AND TIPS  Choose low-cost things you enjoy doing, such as walking, bicycling, or exercising to workout videos.  Take stairs instead of the elevator.   Walk during your lunch break.   Park your car further away from work or school.   Go to a gym or an exercise class.   Start with 5 to 10 minutes of exercise each day. Build up to 30 minutes of exercise 4 to 6 days a week.   Wear shoes with good support and comfortable clothes.   Stretch before and after working out.   Work out until you breathe harder and your heart beats faster.   Drink extra water when you exercise.   Do not do so much that you hurt yourself, feel dizzy, or get very short of breath.  Exercises that burn about 150 calories:  Running 1  miles in 15 minutes.   Playing volleyball for 45 to 60 minutes.   Washing and waxing a car for 45 to 60 minutes.   Playing touch football for 45 minutes.   Walking 1  miles in 35 minutes.   Pushing a stroller 1  miles in 30 minutes.   Playing basketball for 30 minutes.   Raking leaves for 30 minutes.   Bicycling 5 miles in 30 minutes.   Walking 2 miles in 30 minutes.   Dancing for 30 minutes.   Shoveling snow for 15 minutes.   Swimming laps for 20 minutes.   Walking up stairs for 15 minutes.   Bicycling 4 miles in 15 minutes.   Gardening for 30 to 45 minutes.   Jumping rope for 15 minutes.   Washing windows or floors for 45 to 60 minutes.  Document Released: 03/15/2010 Document  Revised: 10/23/2010 Document Reviewed: 03/15/2010 [facility] Patient Information 2012 [place], [place].                                                  Cholesterol Control Diet  Cholesterol levels in your body are determined significantly by your diet. Cholesterol levels may also be related to heart disease. The following material helps to explain this relationship and discusses what you can do to help keep your heart healthy. Not all cholesterol is bad. Low-density lipoprotein (LDL) cholesterol is the "bad" cholesterol. It may cause fatty deposits to build up inside your arteries. High-density lipoprotein (HDL) cholesterol is "good." It helps to remove the "bad" LDL cholesterol from your blood. Cholesterol is a very important risk factor for heart disease. Other risk factors are high blood pressure, smoking, stress, heredity, and weight. The heart muscle gets its supply of blood through the coronary arteries. If your LDL cholesterol is high and your HDL cholesterol is low, you are at risk for having fatty deposits build up in your coronary arteries. This leaves less room through which blood can flow. Without sufficient blood and oxygen, the heart muscle cannot function properly and you may feel chest pains (angina pectoris). When a coronary artery closes up entirely, a part of the heart muscle may die, causing a heart attack (myocardial infarction). CHECKING CHOLESTEROL When your caregiver sends your blood to a lab to be analyzed for cholesterol, a complete lipid (fat) profile may be done. With this test, the total amount of cholesterol and levels of LDL and HDL are determined. Triglycerides are a type of fat that circulates in the blood and can also be used to determine heart disease risk. The list below describes what the  numbers should be: Test: Total Cholesterol.  Less than 200 mg/dl.  Test: LDL "bad cholesterol."  Less than 100 mg/dl.   Less than 70 mg/dl if you are at very high risk of a heart  attack or sudden cardiac death.  Test: HDL "good cholesterol."  Greater than 50 mg/dl for women.   Greater than 40 mg/dl for men.  Test: Triglycerides.  Less than 150 mg/dl.  CONTROLLING CHOLESTEROL WITH DIET Although exercise and lifestyle factors are important, your diet is key. That is because certain foods are known to raise cholesterol and others to lower it. The goal is to balance foods for their effect on cholesterol and more importantly, to replace saturated and trans fat with other types of fat, such as monounsaturated fat, polyunsaturated fat, and omega-3 fatty acids. On average, a person should consume no more than 15 to 17 g of saturated fat daily. Saturated and trans fats are considered "bad" fats, and they will raise LDL cholesterol. Saturated fats are primarily found in animal products such as meats, butter, and cream. However, that does not mean you need to sacrifice all your favorite foods. Today, there are good tasting, low-fat, low-cholesterol substitutes for most of the things you like to eat. Choose low-fat or nonfat alternatives. Choose round or loin cuts of red meat, since these types of cuts are lowest in fat and cholesterol. Chicken (without the skin), fish, veal, and ground [place] breast are excellent choices. Eliminate fatty meats, such as hot dogs and salami. Even shellfish have little or no saturated fat. Have a 3 oz (85 g) portion when you eat lean meat, poultry, or fish. Trans fats are also called "partially hydrogenated oils." They are oils that have been scientifically manipulated so that they are solid at room temperature resulting in a longer shelf life and improved taste and texture of foods in which they are added. Trans fats are found in stick margarine, some tub margarines, cookies, crackers, and baked goods.  When baking and cooking, oils are an excellent substitute for butter. The monounsaturated oils are especially beneficial since it is believed they lower LDL  and raise HDL. The oils you should avoid entirely are saturated tropical oils, such as coconut and palm.  Remember to eat liberally from food groups that are naturally free of saturated and trans fat, including fish, fruit, vegetables, beans, grains (barley, rice, couscous, bulgur wheat), and pasta (without cream sauces).  IDENTIFYING FOODS THAT LOWER CHOLESTEROL  Soluble fiber may lower your cholesterol. This type of fiber is found in fruits such as apples, vegetables such as broccoli, potatoes, and carrots, legumes such as beans, peas, and lentils, and grains such as barley. Foods fortified with plant sterols (phytosterol) may also lower cholesterol. You should eat at least 2 g per day of these foods for a cholesterol lowering effect.  Read package labels to identify low-saturated fats, trans fats free, and low-fat foods at the supermarket. Select cheeses that have only 2 to 3 g saturated fat per ounce. Use a heart-healthy tub margarine that is free of trans fats or partially hydrogenated oil. When buying baked goods (cookies, crackers), avoid partially hydrogenated oils. Breads and muffins should be made from whole grains (whole-wheat or whole oat flour, instead of "flour" or "enriched flour"). Buy non-creamy canned soups with reduced salt and no added fats.  FOOD PREPARATION TECHNIQUES  Never deep-fry. If you must fry, either stir-fry, which uses very little fat, or use non-stick cooking sprays. When possible, broil, bake, or roast  meats, and steam vegetables. Instead of dressing vegetables with butter or margarine, use lemon and herbs, applesauce and cinnamon (for squash and sweet potatoes), nonfat yogurt, salsa, and low-fat dressings for salads.  LOW-SATURATED FAT / LOW-FAT FOOD SUBSTITUTES Meats / Saturated Fat (g)  Avoid: Steak, marbled (3 oz/85 g) / 11 g   Choose: Steak, lean (3 oz/85 g) / 4 g   Avoid: Hamburger (3 oz/85 g) / 7 g   Choose: Hamburger, lean (3 oz/85 g) / 5 g   Avoid: Ham (3  oz/85 g) / 6 g   Choose: Ham, lean cut (3 oz/85 g) / 2.4 g   Avoid: Chicken, with skin, dark meat (3 oz/85 g) / 4 g   Choose: Chicken, skin removed, dark meat (3 oz/85 g) / 2 g   Avoid: Chicken, with skin, light meat (3 oz/85 g) / 2.5 g   Choose: Chicken, skin removed, light meat (3 oz/85 g) / 1 g  Dairy / Saturated Fat (g)  Avoid: Whole milk (1 cup) / 5 g   Choose: Low-fat milk, 2% (1 cup) / 3 g   Choose: Low-fat milk, 1% (1 cup) / 1.5 g   Choose: Skim milk (1 cup) / 0.3 g   Avoid: Hard cheese (1 oz/28 g) / 6 g   Choose: Skim milk cheese (1 oz/28 g) / 2 to 3 g   Avoid: Cottage cheese, 4% fat (1 cup) / 6.5 g   Choose: Low-fat cottage cheese, 1% fat (1 cup) / 1.5 g   Avoid: Ice cream (1 cup) / 9 g   Choose: Sherbet (1 cup) / 2.5 g   Choose: Nonfat frozen yogurt (1 cup) / 0.3 g   Choose: Frozen fruit bar / trace   Avoid: Whipped cream (1 tbs) / 3.5 g   Choose: Nondairy whipped topping (1 tbs) / 1 g  Condiments / Saturated Fat (g)  Avoid: Mayonnaise (1 tbs) / 2 g   Choose: Low-fat mayonnaise (1 tbs) / 1 g   Avoid: Butter (1 tbs) / 7 g   Choose: Extra light margarine (1 tbs) / 1 g   Avoid: Coconut oil (1 tbs) / 11.8 g   Choose: Olive oil (1 tbs) / 1.8 g   Choose: Corn oil (1 tbs) / 1.7 g   Choose: Safflower oil (1 tbs) / 1.2 g   Choose: Sunflower oil (1 tbs) / 1.4 g   Choose: Soybean oil (1 tbs) / 2.4 g   Choose: Canola oil (1 tbs) / 1 g  Document Released: 02/10/2005 Document Revised: 10/23/2010 Document Reviewed: 08/01/2010 [facility] Patient Information 2012 [place], [place].

## 2011-08-26 NOTE — Progress Notes (Signed)
Patient is a 41 year old gravida 2 para 1 AB 1 (1 normal spontaneous vaginal delivery 15 years ago and in 2009 a spontaneous AB resulting in a D&C. Patient has a different partner and would like to try to get pregnant. She has informed me that in 2010 she had been placed on Clomid for 6 months and did not conceive. Patient is having normal menstrual cycle but she is overweight. She had mentioned that approximately 10 years ago she had an HSG which demonstrated that one tube was blocked but she does not recall which one. Her current partner has had several children with a different partner as well. Patient denies any past history of pelvic surgery or PID. Her last menstrual period was reportedly June 28. She does suffer from hypertension been followed by her primary physician and is currently on HCTZ 12.5 mg daily.  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 Patient with new partner. Patient will be scheduled the next few days to undergo an HSG to see if there is been changed to the remaining fallopian tube. We'll also obtain a semen analysis from her partner after 72 hours of abstinence. Patient will be given a prescription Vibramycin 100 mg to take 1 by mouth twice a day starting with the day before the procedure and to include the day of the procedure and the day of the after the procedure. Literature information infertility was provided we'll wait for these results and manage accordingly. She has are started on her prenatal vitamins. She will probably need to be switch to a different antihypertensive medication such as Aldomet that she can continue  during her pregnancy. We discussed also the importance for her to lose weight and to exercise on a regular basis.

## 2011-08-26 NOTE — Progress Notes (Signed)
Patient ID: [name] MRN: [id number], DOB: 1970-12-16, 41 y.o. Date of Encounter: 08/26/2011, 8:27 AM  Primary Physician: No primary provider on file.  Chief Complaint: Physical (CPE)  HPI: 41 y.o. y/o female with history of noted below here for CPE. Doing well. No issues/complaints. 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 Need PPD. Asymptomatic. Never with a positive. History of prehypertension. Previously on HCTZ 12.5mg , but stopped taking this medication one year ago. Feels good, interested in possibly restarting this medication.   LMP: 08/22/2011 Pap: 03/2011, normal MMG: None See scanned in pink sheet  Review of Systems: Consitutional: No fever, chills, fatigue, night sweats, lymphadenopathy, or weight changes. Eyes: No visual changes, eye redness, or discharge. ENT/Mouth: Ears: No otalgia, tinnitus, hearing loss, discharge. Nose: No congestion, rhinorrhea, sinus pain, or epistaxis. Throat: No sore throat, post nasal drip, or teeth pain. Cardiovascular: No CP, palpitations, diaphoresis, DOE, edema, orthopnea, PND. Respiratory: No cough, hemoptysis, SOB, or wheezing. Gastrointestinal: No anorexia, dysphagia, reflux, pain, nausea, vomiting, hematemesis, diarrhea, constipation, BRBPR, or melena. Breast: No discharge, pain, swelling, or mass. Genitourinary: No dysuria, frequency, urgency, hematuria, incontinence, nocturia, amenorrhea, vaginal discharge, pruritis, burning, abnormal bleeding, or pain. Musculoskeletal: No decreased ROM, myalgias, stiffness, joint swelling, or weakness. Skin: No rash, erythema, lesion changes, pain, warmth, jaundice, or pruritis. Neurological: No headache, dizziness, syncope, seizures, tremors, memory loss, coordination problems, or paresthesias. Psychological: No anxiety, depression, hallucinations, SI/HI. Endocrine: No fatigue, polydipsia, polyphagia, polyuria, or known diabetes. All other systems were reviewed and are otherwise negative.  Past  Medical History  Diagnosis Date  . Infertility, female   . Obstructed fallopian tubes     HX OF  LEFT FALLOPIAN TUBE.....   . Heart murmur   . Hypertension      Past Surgical History  Procedure Date  . Laparoscopic cholecystectomy 1992  . Dilation and evacuation 01/27/2008    Home Meds:  Prior to Admission medications   Medication Sig Start Date End Date Taking? Authorizing Provider                  Allergies: No Known Allergies  History   Social History  . Marital Status: Legally Separated    Spouse Name: N/A    Number of Children: N/A  . Years of Education: N/A   Occupational History  . Not on file.   Social History Main Topics  . Smoking status: Former Smoker    Quit date: 03/26/1994  . Smokeless tobacco: Never Used  . Alcohol Use: No  . Drug Use: No  . Sexually Active: Not Currently   Other Topics Concern  . Not on file   Social History Narrative  . No narrative on file    Family History  Problem Relation Age of Onset  . Diabetes Mother   . Hypertension Mother   . Cancer Mother     LIVER - 50  . Breast cancer Maternal Grandmother   . Diabetes Maternal Grandfather     Physical Exam: Blood pressure 134/92, pulse 71, temperature 98.5 F (36.9 C), temperature source Oral, resp. rate 18, height 5' 5.75" (1.67 m), weight 291 lb (131.997 kg), last menstrual period 08/22/2011, SpO2 98.00%., Body mass index is 47.33 kg/(m^2). General: Well developed, well nourished, in no acute distress. HEENT: Normocephalic, atraumatic. Conjunctiva pink, sclera non-icteric. Pupils 2 mm constricting to 1 mm, round, regular, and equally reactive to light and accomodation. EOMI. Internal auditory canal clear. TMs with good cone of light and without pathology. Nasal mucosa pink.  Nares are without discharge. No sinus tenderness. Oral mucosa pink. Dentition normal. Pharynx without exudate.   Neck: Supple. Trachea midline. No thyromegaly. Full ROM. No lymphadenopathy. Lungs:  Clear to auscultation bilaterally without wheezes, rales, or rhonchi. Breathing is of normal effort and unlabored. Cardiovascular: RRR with S1 S2. 2/6 systolic murmur. No rubs or gallops appreciated. Distal pulses 2+ symmetrically. No carotid or abdominal bruits. Breast: Deferred. Abdomen: Soft, non-tender, non-distended with normoactive bowel sounds. No hepatosplenomegaly or masses. No rebound/guarding. No CVA tenderness. Without hernias.  Genitourinary:  Deferred. Musculoskeletal: Full range of motion and 5/5 strength throughout. Without swelling, atrophy, tenderness, crepitus, or warmth. Extremities without clubbing, cyanosis, or edema. Calves supple. Skin: Warm and moist without erythema, ecchymosis, wounds, or rash. Neuro: A+Ox3. CN II-XII grossly intact. Moves all extremities spontaneously. Full sensation throughout. Normal gait. DTR 2+ throughout upper and lower extremities. Finger to nose intact. Psych:  Responds to questions appropriately with a normal affect.   Studies:  Results for orders placed in visit on 08/26/11  POCT CBC      Component Value Range   WBC 7.2  4.6 - 10.2 K/uL   Lymph, poc 2.8  0.6 - 3.4   POC LYMPH PERCENT 38.3  10 - 50 %L   MID (cbc) 0.3  0 - 0.9   POC MID % 4.8  0 - 12 %M   POC Granulocyte 4.1  2 - 6.9   Granulocyte percent 56.9  37 - 80 %G   RBC 5.34  4.04 - 5.48 M/uL   Hemoglobin 12.6  12.2 - 16.2 g/dL   HCT, POC 16.1  09.6 - 47.9 %   MCV 77.6 (*) 80 - 97 fL   MCH, POC 23.6 (*) 27 - 31.2 pg   MCHC 30.4 (*) 31.8 - 35.4 g/dL   RDW, POC 04.5     Platelet Count, POC 478 (*) 142 - 424 K/uL   MPV 8.3  0 - 99.8 fL  POCT UA - MICROSCOPIC ONLY      Component Value Range   WBC, Ur, HPF, POC negative     RBC, urine, microscopic 8-10     Bacteria, U Microscopic trace     Mucus, UA trace     Epithelial cells, urine per micros 1-3     Crystals, Ur, HPF, POC negative     Casts, Ur, LPF, POC negative     Yeast, UA negative    POCT URINALYSIS DIPSTICK       Component Value Range   Color, UA yellow     Clarity, UA clear     Glucose, UA negative     Bilirubin, UA negative     Ketones, UA negative     Spec Grav, UA 1.020     Blood, UA moderate     pH, UA 6.0     Protein, UA negative     Urobilinogen, UA 0.2     Nitrite, UA negative     Leukocytes, UA Negative     Currently on menses.  CMP, Lipid, and TSH all pending.  Assessment/Plan:  41 y.o. y/o female here for CPE with obesity, HTN, murmur, and ,mild IDA. 1. HTN -Restart HCTZ 12.5 mg 1 po daily #30 RF 3 -Weight loss -Helahty diet and exercise -Recheck 6 months  2. Obesity -Weight loss -Healthy diet  3. CPE -PPD placed today, patient to have read at outside clinic secondary to being out of town for the next 5 days -Vaccinations up to date -  Await labs, treat as needed -Form completed  4. Murmur -Known -Cleared by cardiology  5. Iron def anemia -OTC iron tabs/prenatal vitamin -Recheck 3 months  Signed, Eula Listen, PA-C 08/26/2011 8:27 AM

## 2011-08-27 ENCOUNTER — Telehealth: Payer: Self-pay | Admitting: *Deleted

## 2011-08-27 ENCOUNTER — Other Ambulatory Visit: Payer: Self-pay | Admitting: *Deleted

## 2011-08-27 DIAGNOSIS — N979 Female infertility, unspecified: Secondary | ICD-10-CM

## 2011-08-27 NOTE — Addendum Note (Signed)
Addended by: Valeda Malm L on: 08/27/2011 08:40 AM   Modules accepted: Orders

## 2011-08-27 NOTE — Telephone Encounter (Signed)
Called patient to give appt time for HSG 09/01/11 @ 2pm. Informed patient instructions on no intercourse until after test.  Patient states she was told she would not have intercourse restrictions and wants to cancel and reschedule next month because she would be on vacation this weekend. Patient will call back with LMP next month and we will reschedule.

## 2011-08-27 NOTE — Telephone Encounter (Signed)
Message copied by Libby Maw on Wed Aug 27, 2011  8:48 AM ------      Message from: Ok Edwards      Created: Tue Aug 26, 2011  4:47 PM       Mayre Bury please schedule HSG for Monday July 8th for this patient he will be on day 10th of her cycle.Thanks

## 2011-09-01 ENCOUNTER — Ambulatory Visit (HOSPITAL_COMMUNITY): Payer: BC Managed Care – PPO

## 2011-09-11 LAB — TB SKIN TEST

## 2011-09-18 ENCOUNTER — Telehealth: Payer: Self-pay | Admitting: *Deleted

## 2011-09-18 ENCOUNTER — Other Ambulatory Visit: Payer: Self-pay | Admitting: Gynecology

## 2011-09-18 DIAGNOSIS — N979 Female infertility, unspecified: Secondary | ICD-10-CM

## 2011-09-18 NOTE — Telephone Encounter (Signed)
Pt called to have [facility] @ [facility] pm 09/26/11, pt has Vibramycin to take as directed and no sex was informed. Cycle started today.

## 2011-09-26 ENCOUNTER — Ambulatory Visit (HOSPITAL_COMMUNITY)
Admission: RE | Admit: 2011-09-26 | Discharge: 2011-09-26 | Disposition: A | Discharge status: Home / Self Care | Payer: BC Managed Care – PPO | Source: Ambulatory Visit | Attending: Gynecology | Admitting: Gynecology

## 2011-09-26 DIAGNOSIS — N979 Female infertility, unspecified: Secondary | ICD-10-CM | POA: Insufficient documentation

## 2011-09-26 MED ORDER — IOHEXOL 300 MG/ML  SOLN: 6.0000 mL | Freq: Once | INTRAMUSCULAR | Status: AC | PRN

## 2011-09-29 ENCOUNTER — Telehealth: Payer: Self-pay | Admitting: *Deleted

## 2011-09-29 NOTE — Telephone Encounter (Signed)
Left message on pt voicemail regarding the below. 

## 2011-09-29 NOTE — Telephone Encounter (Signed)
Message copied by Aura Camps on Mon Sep 29, 2011  8:48 AM ------      Message from: Ok Edwards      Created: Mon Sep 29, 2011  8:11 AM       Please make sure patient has an appointment to see me this week to discuss the HSG but first make sure that her husband has had a semen analysis and we have the results as well before her visit.

## 2011-10-01 NOTE — Telephone Encounter (Signed)
Left message on pt voicemail to make to make OV.

## 2011-10-06 ENCOUNTER — Ambulatory Visit (INDEPENDENT_AMBULATORY_CARE_PROVIDER_SITE_OTHER): Payer: BC Managed Care – PPO | Admitting: Emergency Medicine

## 2011-10-06 VITALS — BP 120/82 | HR 62 | Temp 98.5°F | Resp 16 | Ht 65.0 in | Wt 288.0 lb

## 2011-10-06 DIAGNOSIS — Z111 Encounter for screening for respiratory tuberculosis: Secondary | ICD-10-CM

## 2011-10-06 NOTE — Progress Notes (Signed)
Had physical and PPD and went on vacation and was not read.  Now returns for repeat PPD

## 2011-10-09 ENCOUNTER — Ambulatory Visit (INDEPENDENT_AMBULATORY_CARE_PROVIDER_SITE_OTHER): Payer: BC Managed Care – PPO | Admitting: Family Medicine

## 2011-10-09 DIAGNOSIS — Z111 Encounter for screening for respiratory tuberculosis: Secondary | ICD-10-CM

## 2012-03-12 ENCOUNTER — Other Ambulatory Visit: Payer: Self-pay | Admitting: Physician Assistant

## 2012-03-15 ENCOUNTER — Other Ambulatory Visit: Payer: Self-pay | Admitting: Physician Assistant

## 2012-09-04 ENCOUNTER — Other Ambulatory Visit: Payer: Self-pay | Admitting: Physician Assistant

## 2012-09-05 NOTE — Telephone Encounter (Signed)
Needs OV, labs 

## 2012-09-22 ENCOUNTER — Encounter: Payer: BC Managed Care – PPO | Admitting: Gynecology

## 2012-11-10 ENCOUNTER — Ambulatory Visit (INDEPENDENT_AMBULATORY_CARE_PROVIDER_SITE_OTHER): Payer: BC Managed Care – PPO | Admitting: Family Medicine

## 2012-11-10 VITALS — BP 122/84 | HR 88 | Temp 98.8°F | Resp 20 | Ht 64.0 in | Wt 286.0 lb

## 2012-11-10 DIAGNOSIS — Z23 Encounter for immunization: Secondary | ICD-10-CM

## 2012-11-10 DIAGNOSIS — Z0000 Encounter for general adult medical examination without abnormal findings: Secondary | ICD-10-CM

## 2012-11-10 LAB — POCT WET PREP WITH KOH
KOH Prep POC: NEGATIVE
Trichomonas, UA: NEGATIVE
Yeast Wet Prep HPF POC: NEGATIVE

## 2012-11-10 LAB — POCT CBC
Granulocyte percent: 51.7 %G (ref 37–80)
HCT, POC: 40.5 % (ref 37.7–47.9)
Lymph, poc: 3.7 — AB (ref 0.6–3.4)
MCV: 78.8 fL — AB (ref 80–97)
POC Granulocyte: 4.5 (ref 2–6.9)
POC LYMPH PERCENT: 42.6 %L (ref 10–50)
Platelet Count, POC: 372 10*3/uL (ref 142–424)
RBC: 5.14 M/uL (ref 4.04–5.48)
RDW, POC: 16.7 %
WBC: 8.7 10*3/uL (ref 4.6–10.2)

## 2012-11-10 LAB — POCT GLYCOSYLATED HEMOGLOBIN (HGB A1C): Hemoglobin A1C: 5.6

## 2012-11-10 MED ORDER — HYDROCHLOROTHIAZIDE 12.5 MG PO CAPS: 12.5000 mg | ORAL_CAPSULE | Freq: Every day | ORAL | Status: DC

## 2012-11-10 MED ORDER — PRENATAL MULTIVITAMIN CH: 1.0000 | ORAL_TABLET | Freq: Every day | ORAL | Status: DC

## 2012-11-10 NOTE — Progress Notes (Signed)
  Subjective:    Patient ID: Marland Kitchen, female    DOB: 05-Aug-1970, 42 y.o.   MRN: [id number]  HPI Patient presents for annual exam. Her only concern today is that she has had some vaginal fishy odor recently. She has had BV in the past. No new sexual partners. Currently sexually active with her husband only. Not using protection but would be okay if she got pregnant. Works in a group home. Recently joined the [employer] 3 weeks ago and has been going 4 days per week.  Has history of HTN and is on HCTZ. No h/o elevated BG. Has both grandmothers with PMH of breast cancer, one in her 37s in the other in her 91s. Has regular menses.  PMH: obesity, HTN, "heart murmur" PSH: Cholecystectomy Meds: HCTZ, PNV Social: Married with one 65 y/o son. Works at group home. No tobacco. No alcohol Family: Reviewed and updated in EMR  Review of Systems  All other systems reviewed and are negative.      Objective:   Physical Exam  Constitutional: She is oriented to person, place, and time. She appears well-developed and well-nourished. No distress.  HENT:  Head: Normocephalic and atraumatic.  Right Ear: External ear normal.  Left Ear: External ear normal.  Mouth/Throat: Oropharynx is clear and moist. No oropharyngeal exudate.  Eyes: Conjunctivae and EOM are normal. Pupils are equal, round, and reactive to light. Right eye exhibits no discharge. Left eye exhibits no discharge. No scleral icterus.  Neck: Normal range of motion. Neck supple. No thyromegaly present.  Cardiovascular: Normal rate, regular rhythm and normal heart sounds.   No murmur heard. Pulmonary/Chest: Effort normal and breath sounds normal. She has no wheezes.  Abdominal: Soft. She exhibits no distension and no mass. There is no tenderness.  Genitourinary: Uterus normal. Vaginal discharge found.  Musculoskeletal: Normal range of motion. She exhibits no edema and no tenderness.  Lymphadenopathy:    She has no cervical adenopathy.   Neurological: She is alert and oriented to person, place, and time.  Skin: Skin is warm and dry. No rash noted. She is not diaphoretic.  Psychiatric: She has a normal mood and affect. Her behavior is normal. Judgment and thought content normal.      Assessment & Plan:  #1. Well woman physical exam - Encourage healthy lifestyle by reinforcing benefit of joining gym, increased fruits and veggies. - Labs: A1c, CBC, BMP, HIV, RPR today. CBC and A1c normal. - Vaginal discharge: GC/CT and wet prep. Wet prep negative. - Pap smear today - RTC one year. - Patient declined flu shot - Tdap today

## 2012-11-10 NOTE — Patient Instructions (Addendum)
Thank you for coming in today.  Keep up the good work going to the gym! Try to work up to exercising for 1 hr on 5 days per week Increase fruits and veggies, limit simple sugar and simple carbs We will let you know about your labs. You do not have anemia. Your sugar is normal.  You did not have any sign of BV on labs today.  Exercise to Lose Weight Exercise and a healthy diet may help you lose weight. Your doctor may suggest specific exercises. EXERCISE IDEAS AND TIPS  Choose low-cost things you enjoy doing, such as walking, bicycling, or exercising to workout videos.  Take stairs instead of the elevator.  Walk during your lunch break.  Park your car further away from work or school.  Go to a gym or an exercise class.  Start with 5 to 10 minutes of exercise each day. Build up to 60 minutes of exercise 4 to 6 days a week.  Wear shoes with good support and comfortable clothes.  Stretch before and after working out.  Work out until you breathe harder and your heart beats faster.  Drink extra water when you exercise.  Do not do so much that you hurt yourself, feel dizzy, or get very short of breath. Exercises that burn about 150 calories:  Running 1  miles in 15 minutes.  Playing volleyball for 45 to 60 minutes.  Washing and waxing a car for 45 to 60 minutes.  Playing touch football for 45 minutes.  Walking 1  miles in 35 minutes.  Pushing a stroller 1  miles in 30 minutes.  Playing basketball for 30 minutes.  Raking leaves for 30 minutes.  Bicycling 5 miles in 30 minutes.  Walking 2 miles in 30 minutes.  Dancing for 30 minutes.  Shoveling snow for 15 minutes.  Swimming laps for 20 minutes.  Walking up stairs for 15 minutes.  Bicycling 4 miles in 15 minutes.  Gardening for 30 to 45 minutes.  Jumping rope for 15 minutes.  Washing windows or floors for 45 to 60 minutes. Document Released: 03/15/2010 Document Revised: 05/05/2011 Document Reviewed:  03/15/2010 [facility] Patient Information 2014 [place], [place].

## 2012-11-11 LAB — BASIC METABOLIC PANEL
Calcium: 9.5 mg/dL (ref 8.4–10.5)
Creat: 0.84 mg/dL (ref 0.50–1.10)
Sodium: 136 mEq/L (ref 135–145)

## 2012-11-11 LAB — HIV ANTIBODY (ROUTINE TESTING W REFLEX): HIV: NONREACTIVE

## 2012-11-11 LAB — RPR

## 2012-11-12 LAB — PAP IG W/ RFLX HPV ASCU

## 2012-11-12 LAB — GC/CHLAMYDIA PROBE AMP: GC Probe RNA: NEGATIVE

## 2012-11-18 ENCOUNTER — Other Ambulatory Visit: Payer: Self-pay | Admitting: Physician Assistant

## 2012-11-19 ENCOUNTER — Encounter: Payer: Self-pay | Admitting: Family Medicine

## 2012-11-19 MED ORDER — METRONIDAZOLE 500 MG PO TABS: 500.0000 mg | ORAL_TABLET | Freq: Two times a day (BID) | ORAL | Status: DC

## 2012-12-02 ENCOUNTER — Encounter: Payer: BC Managed Care – PPO | Admitting: Gynecology

## 2012-12-30 ENCOUNTER — Other Ambulatory Visit: Payer: Self-pay

## 2013-01-08 NOTE — Progress Notes (Signed)
History and physical exams reviewed with Dr. Neomia Dear; agree with A/P.

## 2013-12-09 ENCOUNTER — Other Ambulatory Visit: Payer: Self-pay

## 2013-12-26 ENCOUNTER — Encounter: Payer: BC Managed Care – PPO | Admitting: Family Medicine

## 2013-12-27 ENCOUNTER — Encounter: Payer: BC Managed Care – PPO | Admitting: Family Medicine

## 2013-12-29 ENCOUNTER — Encounter: Payer: Self-pay | Admitting: Gynecology

## 2013-12-29 ENCOUNTER — Ambulatory Visit (INDEPENDENT_AMBULATORY_CARE_PROVIDER_SITE_OTHER): Payer: BC Managed Care – PPO | Admitting: Gynecology

## 2013-12-29 VITALS — BP 130/88 | Ht 64.0 in | Wt 275.0 lb

## 2013-12-29 DIAGNOSIS — N9489 Other specified conditions associated with female genital organs and menstrual cycle: Secondary | ICD-10-CM

## 2013-12-29 DIAGNOSIS — I10 Essential (primary) hypertension: Secondary | ICD-10-CM

## 2013-12-29 DIAGNOSIS — Z113 Encounter for screening for infections with a predominantly sexual mode of transmission: Secondary | ICD-10-CM

## 2013-12-29 DIAGNOSIS — A5901 Trichomonal vulvovaginitis: Secondary | ICD-10-CM

## 2013-12-29 DIAGNOSIS — N898 Other specified noninflammatory disorders of vagina: Secondary | ICD-10-CM

## 2013-12-29 DIAGNOSIS — D251 Intramural leiomyoma of uterus: Secondary | ICD-10-CM

## 2013-12-29 DIAGNOSIS — Z01419 Encounter for gynecological examination (general) (routine) without abnormal findings: Secondary | ICD-10-CM

## 2013-12-29 LAB — WET PREP FOR TRICH, YEAST, CLUE: Yeast Wet Prep HPF POC: NONE SEEN

## 2013-12-29 MED ORDER — METRONIDAZOLE 500 MG PO TABS: 500.0000 mg | ORAL_TABLET | Freq: Two times a day (BID) | ORAL | Status: DC

## 2013-12-29 MED ORDER — HYDROCHLOROTHIAZIDE 12.5 MG PO CAPS: 12.5000 mg | ORAL_CAPSULE | Freq: Every day | ORAL | Status: DC

## 2013-12-29 NOTE — Progress Notes (Signed)
[name] 10-20-1970 [id number]   History:    43 y.o.  for annual gyn exam with complaint of vaginal odor. Patient has been married a year and a half now. She was last seen in the office in 2013. She reports normal menstrual cycle. Patient using no form of contraception. Review of her record indicated the following:  43 year old gravida 2 para 1 AB 1 (1 normal spontaneous vaginal delivery 15 years ago and in 2009 D) for an incomplete mole. Patient is not using any form of contraception many years ago she had an HSG which demonstrated that one tube was blocked but she does not recall which one. Her current partner has had several children with a different partner as well. She does suffer from hypertension been followed by her primary physician and is currently on HCTZ 12.5 mg daily   Past medical history,surgical history, family history and social history were all reviewed and documented in the EPIC chart.  Gynecologic History Patient's last menstrual period was 12/05/2013. Contraception: none Last Pap: 2013. Results were: normal Last mammogram: no prior study. Results were: no prior study  Obstetric History OB History  Gravida Para Term Preterm AB SAB TAB Ectopic Multiple Living  2 1 1  1 1    1     # Outcome Date GA Lbr Len/2nd Weight Sex Delivery Anes PTL Lv  2 Term      Vag-Spont  N Y  1 SAB                ROS: A ROS was performed and pertinent positives and negatives are included in the history.  GENERAL: No fevers or chills. HEENT: No change in vision, no earache, sore throat or sinus congestion. NECK: No pain or stiffness. CARDIOVASCULAR: No chest pain or pressure. No palpitations. PULMONARY: No shortness of breath, cough or wheeze. GASTROINTESTINAL: No abdominal pain, nausea, vomiting or diarrhea, melena or bright red blood per rectum. GENITOURINARY: No urinary frequency, urgency, hesitancy or dysuria. MUSCULOSKELETAL: No joint or muscle pain, no back pain, no recent  trauma. DERMATOLOGIC: No rash, no itching, no lesions. ENDOCRINE: No polyuria, polydipsia, no heat or cold intolerance. No recent change in weight. HEMATOLOGICAL: No anemia or easy bruising or bleeding. NEUROLOGIC: No headache, seizures, numbness, tingling or weakness. PSYCHIATRIC: No depression, no loss of interest in normal activity or change in sleep pattern.     Exam: chaperone present  BP 130/88 mmHg  Ht 5\' 4"  (1.626 m)  Wt 275 lb (124.739 kg)  BMI 47.18 kg/m2  LMP 12/05/2013  Body mass index is 47.18 kg/(m^2).  General appearance : Well developed well nourished female. No acute distress HEENT: Neck supple, trachea midline, no carotid bruits, no thyroidmegaly Lungs: Clear to auscultation, no rhonchi or wheezes, or rib retractions  Heart: Regular rate and rhythm, no murmurs or gallops Breast:Examined in sitting and supine position were symmetrical in appearance, no palpable masses or tenderness,  no skin retraction, no nipple inversion, no nipple discharge, no skin discoloration, no axillary or supraclavicular lymphadenopathy Abdomen: no palpable masses or tenderness, no rebound or guarding Extremities: no edema or skin discoloration or tenderness  Pelvic:  Bartholin, Urethra, Skene Glands: Within normal limits             Vagina: No gross lesions or discharge, Fish like odor clear discharge  Cervix: No gross lesions or discharge  Uterus  anteverted, normal size, shape and consistency, non-tender and mobile  Adnexa  Without masses or tenderness  Anus  and perineum  normal   Rectovaginal  normal sphincter tone without palpated masses or tenderness             Hemoccult not indicated   Wet prep: Positive amine, rare Trichomonas, many clue cells, few WBC, too numerous to count bacteria  GC and Chlamydia culture pending  Assessment/Plan:  43 y.o. female for annual exam with clinical evidence of trichomoniasis. She and her partner will be treated with Flagyl 500 mg twice a day for 7  days. GC and Chlamydia culture obtained results pending at time of this dictation. She will stop by the lab today to check an HIV, RPR, hepatitis B and C. Patient with history of fibroid uterus returned back to the office in a few weeks for an ultrasound and we'll also do her fasting blood work at the same time consisting of a conference metabolic panel, fasting lipid profile, TSH, CBC, and urinalysis. Pap smear was not done today. Patient was provided with a requisition to schedule her mammogram which is overdue.   Terrance Mass MD, 1:52 PM 12/29/2013

## 2013-12-29 NOTE — Patient Instructions (Signed)

## 2013-12-30 LAB — GC/CHLAMYDIA PROBE AMP
CT PROBE, AMP APTIMA: NEGATIVE
GC PROBE AMP APTIMA: NEGATIVE

## 2014-01-11 ENCOUNTER — Encounter: Payer: Self-pay | Admitting: Gynecology

## 2014-01-18 ENCOUNTER — Ambulatory Visit: Payer: BC Managed Care – PPO | Admitting: Gynecology

## 2014-01-18 ENCOUNTER — Other Ambulatory Visit: Payer: BC Managed Care – PPO

## 2014-02-28 ENCOUNTER — Telehealth: Payer: Self-pay | Admitting: Anesthesiology

## 2014-02-28 NOTE — Telephone Encounter (Signed)
Left message on patient's voicemail reminding her that she forgot to stop by the lab on her last office visit.  That the lab is open from 8:30am until 5:00pm and she stop by the office this week

## 2014-03-08 ENCOUNTER — Telehealth: Payer: Self-pay | Admitting: Anesthesiology

## 2014-03-08 NOTE — Telephone Encounter (Signed)
Left message on patient's voicemail. Reminding her that she needs to return to the office to get labs drawn. She forgot to stop by the lab on her last office visit.Marland Kitchen

## 2014-05-10 ENCOUNTER — Other Ambulatory Visit: Payer: Self-pay

## 2014-05-10 DIAGNOSIS — Z1231 Encounter for screening mammogram for malignant neoplasm of breast: Secondary | ICD-10-CM

## 2014-05-12 ENCOUNTER — Ambulatory Visit
Admission: RE | Admit: 2014-05-12 | Discharge: 2014-05-12 | Disposition: A | Discharge status: Home / Self Care | Payer: PRIVATE HEALTH INSURANCE | Source: Ambulatory Visit

## 2014-05-12 DIAGNOSIS — Z1231 Encounter for screening mammogram for malignant neoplasm of breast: Secondary | ICD-10-CM

## 2014-05-15 ENCOUNTER — Other Ambulatory Visit: Payer: Self-pay | Admitting: Gynecology

## 2014-05-15 DIAGNOSIS — R928 Other abnormal and inconclusive findings on diagnostic imaging of breast: Secondary | ICD-10-CM

## 2014-05-19 ENCOUNTER — Ambulatory Visit
Admission: RE | Admit: 2014-05-19 | Discharge: 2014-05-19 | Disposition: A | Discharge status: Home / Self Care | Payer: PRIVATE HEALTH INSURANCE | Source: Ambulatory Visit | Attending: Gynecology | Admitting: Gynecology

## 2014-05-19 DIAGNOSIS — R928 Other abnormal and inconclusive findings on diagnostic imaging of breast: Secondary | ICD-10-CM

## 2014-10-04 ENCOUNTER — Ambulatory Visit (INDEPENDENT_AMBULATORY_CARE_PROVIDER_SITE_OTHER): Payer: PRIVATE HEALTH INSURANCE | Admitting: Physician Assistant

## 2014-10-04 VITALS — BP 146/88 | HR 94 | Temp 98.3°F | Resp 18 | Ht 65.0 in | Wt 293.6 lb

## 2014-10-04 DIAGNOSIS — A499 Bacterial infection, unspecified: Secondary | ICD-10-CM | POA: Diagnosis not present

## 2014-10-04 DIAGNOSIS — N760 Acute vaginitis: Secondary | ICD-10-CM | POA: Diagnosis not present

## 2014-10-04 DIAGNOSIS — Z202 Contact with and (suspected) exposure to infections with a predominantly sexual mode of transmission: Secondary | ICD-10-CM | POA: Diagnosis not present

## 2014-10-04 DIAGNOSIS — B9689 Other specified bacterial agents as the cause of diseases classified elsewhere: Secondary | ICD-10-CM

## 2014-10-04 LAB — POCT WET PREP WITH KOH
KOH PREP POC: NEGATIVE
TRICHOMONAS UA: NEGATIVE
Yeast Wet Prep HPF POC: NEGATIVE

## 2014-10-04 MED ORDER — METRONIDAZOLE 500 MG PO TABS
500.0000 mg | ORAL_TABLET | Freq: Two times a day (BID) | ORAL | Status: AC
Start: 2014-10-04 — End: 2014-10-11

## 2014-10-04 NOTE — Patient Instructions (Addendum)
Do not engage in sexual practices until after medication is completed. Please attempt to eliminate some of the scented harsh hygeine products that may be causing the BV to occur.  Bacterial Vaginosis Bacterial vaginosis is a vaginal infection that occurs when the normal balance of bacteria in the vagina is disrupted. It results from an overgrowth of certain bacteria. This is the most common vaginal infection in women of childbearing age. Treatment is important to prevent complications, especially in pregnant women, as it can cause a premature delivery. CAUSES  Bacterial vaginosis is caused by an increase in harmful bacteria that are normally present in smaller amounts in the vagina. Several different kinds of bacteria can cause bacterial vaginosis. However, the reason that the condition develops is not fully understood. RISK FACTORS Certain activities or behaviors can put you at an increased risk of developing bacterial vaginosis, including:  Having a new sex partner or multiple sex partners.  Douching.  Using an intrauterine device (IUD) for contraception. Women do not get bacterial vaginosis from toilet seats, bedding, swimming pools, or contact with objects around them. SIGNS AND SYMPTOMS  Some women with bacterial vaginosis have no signs or symptoms. Common symptoms include:  Grey vaginal discharge.  A fishlike odor with discharge, especially after sexual intercourse.  Itching or burning of the vagina and vulva.  Burning or pain with urination. DIAGNOSIS  Your health care provider will take a medical history and examine the vagina for signs of bacterial vaginosis. A sample of vaginal fluid may be taken. Your health care provider will look at this sample under a microscope to check for bacteria and abnormal cells. A vaginal pH test may also be done.  TREATMENT  Bacterial vaginosis may be treated with antibiotic medicines. These may be given in the form of a pill or a vaginal cream. A  second round of antibiotics may be prescribed if the condition comes back after treatment.  HOME CARE INSTRUCTIONS   Only take over-the-counter or prescription medicines as directed by your health care provider.  If antibiotic medicine was prescribed, take it as directed. Make sure you finish it even if you start to feel better.  Do not have sex until treatment is completed.  Tell all sexual partners that you have a vaginal infection. They should see their health care provider and be treated if they have problems, such as a mild rash or itching.  Practice safe sex by using condoms and only having one sex partner. SEEK MEDICAL CARE IF:   Your symptoms are not improving after 3 days of treatment.  You have increased discharge or pain.  You have a fever. MAKE SURE YOU:   Understand these instructions.  Will watch your condition.  Will get help right away if you are not doing well or get worse. FOR MORE INFORMATION  Centers for Disease Control and Prevention, Division of STD Prevention: [employer]raiser[employer]raud.[employer] American Sexual Health Association (ASHA): www.ashastd.org  Document Released: 02/10/2005 Document Revised: 12/01/2012 Document Reviewed: 09/22/2012 [facility] Patient Information 2015 [place], [place]. This information is not intended to replace advice given to you by your health care provider. Make sure you discuss any questions you have with your health care provider.

## 2014-10-04 NOTE — Progress Notes (Signed)
[facility] [address] [place] [address] [phone]  Date:  10/04/2014   Name:  [name]   DOB:  Oct 31, 1970   MRN:  [id number]  PCP:  Kennon Portela, MD    History of Present Illness:  [name] is a 44 y.o. female patient who presents to [facility] for STD exposure.  Husband was at the hospital for heart condition, and had asked for testing and found to have trichomoniasis 2 days ago.  She was concerned, though she has no vaginal symptoms of abnormal vaginal discharge, odor, or rash.  She had tested positive 12/2013 10 months ago for trichomoniasis. Both her and her husband were treated, however she states that they will were sexually active during treatment as instructed not to.     Patient Active Problem List   Diagnosis Date Noted  . Trichomoniasis of vagina 12/29/2013  . Infertility, female 08/26/2011  . Obesity 08/26/2011  . HTN (hypertension) 03/27/2011    Past Medical History  Diagnosis Date  . Infertility, female   . Obstructed fallopian tubes     HX OF  LEFT FALLOPIAN TUBE.....   . Heart murmur   . Hypertension     Past Surgical History  Procedure Laterality Date  . Laparoscopic cholecystectomy  1992  . Dilation and evacuation  01/27/2008    Social History  Substance Use Topics  . Smoking status: Former Smoker    Quit date: 03/26/1994  . Smokeless tobacco: Never Used  . Alcohol Use: No    Family History  Problem Relation Age of Onset  . Diabetes Mother   . Hypertension Mother   . Cancer Mother     LIVER - 34  . Breast cancer Maternal Grandmother   . Diabetes Maternal Grandfather   . Diabetes Sister   . Cancer Paternal Grandmother   . Cirrhosis Paternal Grandfather     No Known Allergies  Medication list has been reviewed and updated.  Current Outpatient Prescriptions on File Prior to Visit  Medication Sig Dispense Refill  . hydrochlorothiazide (MICROZIDE) 12.5 MG capsule Take 1 capsule (12.5 mg total)  by mouth daily. 30 capsule 11  . Prenatal Vit-Fe Fumarate-FA (PRENATAL MULTIVITAMIN) TABS tablet Take 1 tablet by mouth daily. 30 tablet 11   No current facility-administered medications on file prior to visit.    ROS ROS otherwise unremarkable unless listed above.  Physical Examination: BP 146/88 mmHg  Pulse 94  Temp(Src) 98.3 F (36.8 C) (Oral)  Resp 18  Ht 5\' 5"  (1.651 m)  Wt 293 lb 9.6 oz (133.176 kg)  BMI 48.86 kg/m2  SpO2 99%  LMP 09/26/2014 Ideal Body Weight: Weight in (lb) to have BMI = 25: 149.9  Physical Exam Alert, cooperative, oriented 4 in no acute distress. PERRLA with normal conjunctiva. Normal heart rate. No use of accessory muscles or respiratory distress. Genitourinary exam: No rash along the genitalia highly via. Vaginal walls appear normal. There is copious white discharge that is mildly odorous.  Assessment and Plan: 44 year old female with past medical history listed above is here today for STD exposure. She declines further STD testing of HIV, syphilis, or gonorrhea chlamydia at this time. There is no trichomoniasis to note on microscopy.  However, the metronidazole should treat any trich.  There is concern for BV recurrent infection. I have discussed with her changes in using scented deodorant soaps as well as with her clothes.  I have also instructed her not to engage in  sexual intercourse with husband until he completes his tx.   1. STD exposure - POCT Wet Prep with KOH - metroNIDAZOLE (FLAGYL) 500 MG tablet; Take 1 tablet (500 mg total) by mouth 2 (two) times daily.  Dispense: 14 tablet; Refill: 0  2. Bacterial vaginosis - metroNIDAZOLE (FLAGYL) 500 MG tablet; Take 1 tablet (500 mg total) by mouth 2 (two) times daily.  Dispense: 14 tablet; Refill: 0   Ivar Drape, PA-C [facility] 10/04/2014 7:24 PM

## 2014-10-07 NOTE — Progress Notes (Signed)
  Medical screening examination/treatment/procedure(s) were performed by non-physician practitioner and as supervising physician I was immediately available for consultation/collaboration.     

## 2015-02-16 ENCOUNTER — Other Ambulatory Visit: Payer: Self-pay | Admitting: Gynecology

## 2015-02-28 ENCOUNTER — Ambulatory Visit (INDEPENDENT_AMBULATORY_CARE_PROVIDER_SITE_OTHER): Payer: PRIVATE HEALTH INSURANCE

## 2015-02-28 ENCOUNTER — Ambulatory Visit (INDEPENDENT_AMBULATORY_CARE_PROVIDER_SITE_OTHER): Payer: PRIVATE HEALTH INSURANCE | Admitting: Family Medicine

## 2015-02-28 VITALS — BP 114/80 | HR 80 | Temp 98.3°F | Resp 18 | Ht 65.0 in | Wt 296.0 lb

## 2015-02-28 DIAGNOSIS — I10 Essential (primary) hypertension: Secondary | ICD-10-CM

## 2015-02-28 DIAGNOSIS — J22 Unspecified acute lower respiratory infection: Secondary | ICD-10-CM

## 2015-02-28 DIAGNOSIS — R05 Cough: Secondary | ICD-10-CM

## 2015-02-28 DIAGNOSIS — J988 Other specified respiratory disorders: Secondary | ICD-10-CM

## 2015-02-28 DIAGNOSIS — R059 Cough, unspecified: Secondary | ICD-10-CM

## 2015-02-28 MED ORDER — BENZONATATE 100 MG PO CAPS: 200.0000 mg | ORAL_CAPSULE | Freq: Two times a day (BID) | ORAL | Status: DC | PRN

## 2015-02-28 MED ORDER — HYDROCODONE-HOMATROPINE 5-1.5 MG/5ML PO SYRP: 5.0000 mL | ORAL_SOLUTION | Freq: Every evening | ORAL | Status: DC | PRN

## 2015-02-28 MED ORDER — ALBUTEROL SULFATE (2.5 MG/3ML) 0.083% IN NEBU
2.5000 mg | INHALATION_SOLUTION | Freq: Once | RESPIRATORY_TRACT | Status: AC
  Administered 2015-02-28: 2.5 mg via RESPIRATORY_TRACT

## 2015-02-28 MED ORDER — HYDROCHLOROTHIAZIDE 12.5 MG PO CAPS: 12.5000 mg | ORAL_CAPSULE | Freq: Every day | ORAL | Status: DC

## 2015-02-28 MED ORDER — IPRATROPIUM BROMIDE 0.02 % IN SOLN
0.5000 mg | Freq: Once | RESPIRATORY_TRACT | Status: AC
  Administered 2015-02-28: 0.5 mg via RESPIRATORY_TRACT

## 2015-02-28 MED ORDER — ALBUTEROL SULFATE HFA 108 (90 BASE) MCG/ACT IN AERS: 2.0000 | INHALATION_SPRAY | Freq: Four times a day (QID) | RESPIRATORY_TRACT | Status: DC | PRN

## 2015-02-28 MED ORDER — AZITHROMYCIN 250 MG PO TABS: ORAL_TABLET | ORAL | Status: DC

## 2015-02-28 NOTE — Progress Notes (Signed)
Chief Complaint:  Chief Complaint  Patient presents with  . Cough    productive, yellow/green x 1 week  . Ear Pain    bilateral  . Sore Throat  . Medication Refill    hctz    HPI: [name] is a 45 y.o. female who reports to [facility] today complaining of 1 week hx of yellow productive cough, cough worse at night, she was having cold sxs and then she thought improved but now has returned and worse. Cough is still  Nonproductive, dry  Cough. She ahs taken therafu, has taken cough drops, has taken dayquil, and also has done robitussin cough and chest. She is worried about having pneumonia.  She feels that it is hard for her to get a good breath although she ahs no CP or SOB. + ear pain, sinus congestion, throat painw ith cough  She has been getting hctz refills for one year , low dose 12.5 , no SEs, takes it regular.   Past Medical History  Diagnosis Date  . Infertility, female   . Obstructed fallopian tubes     HX OF  LEFT FALLOPIAN TUBE.....   . Heart murmur   . Hypertension    Past Surgical History  Procedure Laterality Date  . Laparoscopic cholecystectomy  1992  . Dilation and evacuation  01/27/2008   Social History   Social History  . Marital Status: Married    Spouse Name: N/A  . Number of Children: N/A  . Years of Education: N/A   Social History Main Topics  . Smoking status: Former Smoker    Quit date: 03/26/1994  . Smokeless tobacco: Never Used  . Alcohol Use: No  . Drug Use: No  . Sexual Activity: Yes    Birth Control/ Protection: None   Other Topics Concern  . None   Social History Narrative   Family History  Problem Relation Age of Onset  . Diabetes Mother   . Hypertension Mother   . Cancer Mother     LIVER - 5  . Breast cancer Maternal Grandmother   . Diabetes Maternal Grandfather   . Diabetes Sister   . Cancer Paternal Grandmother   . Cirrhosis Paternal Grandfather    No Known Allergies Prior to Admission medications     Medication Sig Start Date End Date Taking? Authorizing Provider  hydrochlorothiazide (MICROZIDE) 12.5 MG capsule Take 1 capsule (12.5 mg total) by mouth daily. 12/29/13  Yes Terrance Mass, MD  Prenatal Vit-Fe Fumarate-FA (PRENATAL MULTIVITAMIN) TABS tablet Take 1 tablet by mouth daily. Patient not taking: Reported on 02/28/2015 11/10/12   Duane Boston, MD     ROS: The patient denies fevers, chills, night sweats, unintentional weight loss, chest pain, palpitations, wheezing, dyspnea on exertion, nausea, vomiting, abdominal pain, dysuria, hematuria, melena, numbness, weakness, or tingling.  All other systems have been reviewed and were otherwise negative with the exception of those mentioned in the HPI and as above.    PHYSICAL EXAM: Filed Vitals:   02/28/15 1831  BP: 114/80  Pulse: 80  Temp: 98.3 F (36.8 C)  Resp: 18   Body mass index is 49.26 kg/(m^2).   General: Alert, no acute distress HEENT:  Normocephalic, atraumatic, oropharynx patent. EOMI, PERRLA Erythematous throat, no exudates, TM normal, +/- sinus tenderness, + erythematous/boggy nasal mucosa Cardiovascular:  Regular rate and rhythm, no rubs murmurs or gallops.  No Carotid bruits, radial pulse intact. No pedal edema.  Respiratory: Clear to auscultation bilaterally.  No wheezes, rales, or rhonchi.  No cyanosis, no use of accessory musculature Abdominal: No organomegaly, abdomen is soft and non-tender, positive bowel sounds. No masses. Skin: No rashes. Neurologic: Facial musculature symmetric. Psychiatric: Patient acts appropriately throughout our interaction. Lymphatic: No cervical or submandibular lymphadenopathy Musculoskeletal: Gait intact. No edema, tenderness   LABS: Results for orders placed or performed in visit on 10/04/14  POCT Wet Prep with KOH  Result Value Ref Range   Trichomonas, UA Negative    Clue Cells Wet Prep HPF POC 5-10    Epithelial Wet Prep HPF POC Moderate Few, Moderate, Many   Yeast Wet  Prep HPF POC neg    Bacteria Wet Prep HPF POC Moderate (A) None, Few   RBC Wet Prep HPF POC 1-3    WBC Wet Prep HPF POC 3-6    KOH Prep POC Negative      EKG/XRAY:   Primary read interpreted by Dr. Marin Comment at [facility]. ? Right lower lobe opacity vs bronchitic changes   ASSESSMENT/PLAN: Encounter Diagnoses  Name Primary?  . Essential hypertension   . Cough   . Lower respiratory infection (e.g., bronchitis, pneumonia, pneumonitis, pulmonitis) Yes   Neb treatment, improved movement of air after treatment in office x 1 Rx tessalon, hycodan prn, azithromycin, albuterol inh prn  Refill HCTZ, will get BMP  Fu prn   Gross sideeffects, risk and benefits, and alternatives of medications d/w patient. Patient is aware that all medications have potential sideeffects and we are unable to predict every sideeffect or drug-drug interaction that may occur.  Ashelyn Mccravy DO  02/28/2015 9:07 PM

## 2015-03-01 LAB — BASIC METABOLIC PANEL
BUN: 8 mg/dL (ref 7–25)
Calcium: 9.4 mg/dL (ref 8.6–10.2)
Chloride: 101 mmol/L (ref 98–110)
Creat: 0.84 mg/dL (ref 0.50–1.10)
Glucose, Bld: 89 mg/dL (ref 65–99)
Potassium: 4.1 mmol/L (ref 3.5–5.3)

## 2015-03-01 LAB — BASIC METABOLIC PANEL WITH GFR
CO2: 25 mmol/L (ref 20–31)
Sodium: 136 mmol/L (ref 135–146)

## 2016-02-27 ENCOUNTER — Encounter: Payer: Self-pay | Admitting: Physician Assistant

## 2016-02-27 ENCOUNTER — Ambulatory Visit (INDEPENDENT_AMBULATORY_CARE_PROVIDER_SITE_OTHER): Payer: PRIVATE HEALTH INSURANCE | Admitting: Physician Assistant

## 2016-02-27 VITALS — BP 126/78 | HR 70 | Temp 98.6°F | Wt 283.6 lb

## 2016-02-27 DIAGNOSIS — J069 Acute upper respiratory infection, unspecified: Secondary | ICD-10-CM | POA: Diagnosis not present

## 2016-02-27 MED ORDER — IPRATROPIUM BROMIDE 0.06 % NA SOLN: 2.0000 | Freq: Three times a day (TID) | NASAL | 0 refills | Status: DC

## 2016-02-27 MED ORDER — HYDROCODONE-HOMATROPINE 5-1.5 MG/5ML PO SYRP
5.0000 mL | ORAL_SOLUTION | Freq: Every evening | ORAL | 0 refills | Status: DC | PRN
Start: 2016-02-27 — End: 2017-01-28

## 2016-02-27 MED ORDER — BENZONATATE 100 MG PO CAPS: 200.0000 mg | ORAL_CAPSULE | Freq: Two times a day (BID) | ORAL | 1 refills | Status: DC | PRN

## 2016-02-27 NOTE — Progress Notes (Signed)
   [name]  MRN: [id number] DOB: August 06, 1970  Subjective:  Pt presents to clinic with cold symptoms for the last week.  She started with sneezing and nasal congestion and then the last several days she started developing a cough that is now tight.  She has been using mucinex and dayquil and vicks for the cough.  Review of Systems  Constitutional: Negative for chills and fever.  HENT: Positive for congestion, postnasal drip and rhinorrhea.   Respiratory: Positive for cough and wheezing. Negative for shortness of breath.        No h/o asthma, family h/o asthma, nonsmoker  Neurological: Negative for headaches.  Psychiatric/Behavioral: Positive for sleep disturbance (2nd to cough).    Patient Active Problem List   Diagnosis Date Noted  . Trichomoniasis of vagina 12/29/2013  . Infertility, female 08/26/2011  . Obesity 08/26/2011  . HTN (hypertension) 03/27/2011    Current Outpatient Prescriptions on File Prior to Visit  Medication Sig Dispense Refill  . albuterol (PROVENTIL HFA;VENTOLIN HFA) 108 (90 Base) MCG/ACT inhaler Inhale 2 puffs into the lungs every 6 (six) hours as needed for wheezing or shortness of breath. 1 Inhaler 0  . hydrochlorothiazide (MICROZIDE) 12.5 MG capsule Take 1 capsule (12.5 mg total) by mouth daily. 30 capsule 11  . Prenatal Vit-Fe Fumarate-FA (PRENATAL MULTIVITAMIN) TABS tablet Take 1 tablet by mouth daily. 30 tablet 11   No current facility-administered medications on file prior to visit.     No Known Allergies  Pt patients past, family and social history were reviewed and updated.   Objective:  BP 126/78 (BP Location: Right Arm, Cuff Size: Large)   Pulse 70   Temp 98.6 F (37 C) (Oral)   Wt 283 lb 9.6 oz (128.6 kg)   SpO2 99%   BMI 47.19 kg/m   Physical Exam  Constitutional: She is oriented to person, place, and time and well-developed, well-nourished, and in no distress.  HENT:  Head: Normocephalic and atraumatic.  Right Ear:  Hearing, tympanic membrane, external ear and ear canal normal.  Left Ear: Hearing, tympanic membrane, external ear and ear canal normal.  Nose: Mucosal edema (pale and swollen) and rhinorrhea (clear) present.  Mouth/Throat: Uvula is midline, oropharynx is clear and moist and mucous membranes are normal.  Eyes: Conjunctivae are normal.  Neck: Normal range of motion.  Cardiovascular: Normal rate, regular rhythm and normal heart sounds.   No murmur heard. Pulmonary/Chest: Effort normal and breath sounds normal.  Neurological: She is alert and oriented to person, place, and time. Gait normal.  Skin: Skin is warm and dry.  Psychiatric: Mood, memory, affect and judgment normal.  Vitals reviewed.   Assessment and Plan :  URI with cough and congestion - Plan: benzonatate (TESSALON) 100 MG capsule, HYDROcodone-homatropine (HYCODAN) 5-1.5 MG/5ML syrup, ipratropium (ATROVENT) 0.06 % nasal spray symptomatic treatment - continue humidifier at home and good hydration.  Windell Hummingbird PA-C  [facility] 02/27/2016 2:56 PM

## 2016-02-27 NOTE — Patient Instructions (Signed)
Please push fluids.  Tylenol and Motrin for fever and body aches.    A humidifier can help especially when the air is dry -if you do not have a humidifier you can boil a pot of water on the stove in your home to help with the dry air.  Nasal saline spray can be helpful to keep the mucus membranes moist and thin the nasal mucus

## 2016-04-08 ENCOUNTER — Other Ambulatory Visit: Payer: Self-pay | Admitting: Physician Assistant

## 2016-04-08 DIAGNOSIS — J069 Acute upper respiratory infection, unspecified: Secondary | ICD-10-CM

## 2016-07-09 ENCOUNTER — Encounter: Payer: Self-pay | Admitting: Gynecology

## 2017-01-21 ENCOUNTER — Other Ambulatory Visit: Payer: Self-pay | Admitting: Family Medicine

## 2017-01-21 DIAGNOSIS — Z1231 Encounter for screening mammogram for malignant neoplasm of breast: Secondary | ICD-10-CM

## 2017-01-28 ENCOUNTER — Encounter: Payer: Self-pay | Admitting: Family Medicine

## 2017-01-28 ENCOUNTER — Other Ambulatory Visit: Payer: Self-pay

## 2017-01-28 ENCOUNTER — Ambulatory Visit (INDEPENDENT_AMBULATORY_CARE_PROVIDER_SITE_OTHER): Payer: PRIVATE HEALTH INSURANCE | Admitting: Family Medicine

## 2017-01-28 VITALS — BP 140/90 | HR 101 | Temp 98.6°F | Resp 17 | Ht 65.0 in | Wt 286.8 lb

## 2017-01-28 DIAGNOSIS — J069 Acute upper respiratory infection, unspecified: Secondary | ICD-10-CM | POA: Diagnosis not present

## 2017-01-28 DIAGNOSIS — H6123 Impacted cerumen, bilateral: Secondary | ICD-10-CM

## 2017-01-28 DIAGNOSIS — Z13220 Encounter for screening for lipoid disorders: Secondary | ICD-10-CM | POA: Diagnosis not present

## 2017-01-28 DIAGNOSIS — I10 Essential (primary) hypertension: Secondary | ICD-10-CM | POA: Diagnosis not present

## 2017-01-28 DIAGNOSIS — Z1329 Encounter for screening for other suspected endocrine disorder: Secondary | ICD-10-CM | POA: Diagnosis not present

## 2017-01-28 DIAGNOSIS — Z131 Encounter for screening for diabetes mellitus: Secondary | ICD-10-CM | POA: Diagnosis not present

## 2017-01-28 DIAGNOSIS — Z833 Family history of diabetes mellitus: Secondary | ICD-10-CM | POA: Diagnosis not present

## 2017-01-28 MED ORDER — CARBAMIDE PEROXIDE 6.5 % OT SOLN: 5.0000 [drp] | Freq: Two times a day (BID) | OTIC | 0 refills | Status: AC

## 2017-01-28 MED ORDER — HYDROCHLOROTHIAZIDE 12.5 MG PO CAPS: 12.5000 mg | ORAL_CAPSULE | Freq: Every day | ORAL | 0 refills | Status: DC

## 2017-01-28 MED ORDER — ALBUTEROL SULFATE HFA 108 (90 BASE) MCG/ACT IN AERS: 2.0000 | INHALATION_SPRAY | Freq: Four times a day (QID) | RESPIRATORY_TRACT | 0 refills | Status: AC | PRN

## 2017-01-28 NOTE — Patient Instructions (Addendum)
1. For upper respiratory infection use albuterol as needed and add on zyrtec or claritin. zytrec makes people sleepy so take it at night. 2. Start exercising for 10 minutes of the 1440 minutes a day 3. Select an accountability buddy to help you with success with exercise 4. Restart your blood pressure medications 5. Follow up in one month    IF you received an x-ray today, you will receive an invoice from [facility]. Please contact [facility] at [phone] with questions or concerns regarding your invoice.   IF you received labwork today, you will receive an invoice from [facility]. Please contact [facility] at [phone] with questions or concerns regarding your invoice.   Our billing staff will not be able to assist you with questions regarding bills from these companies.  You will be contacted with the lab results as soon as they are available. The fastest way to get your results is to activate your My Chart account. Instructions are located on the last page of this paperwork. If you have not heard from [place] regarding the results in 2 weeks, please contact this office.    We recommend that you schedule a mammogram for breast cancer screening. Typically, you do not need a referral to do this. Please contact a local imaging center to schedule your mammogram.  [facility] - [phone]  *ask for the Radiology Department [facility] [facility]) - [phone] or [phone]  [facility] - [phone] [place] [phone] [facility] - [phone]  *ask for the [facility] - [phone]  *ask for the Radiology Department [facility] - [phone]  *ask for the [place] - [phone]

## 2017-01-28 NOTE — Progress Notes (Signed)
Chief Complaint  Patient presents with  . New Patient (Initial Visit)    establish care with new provider, ? physical done    HPI  Hypertension: Patient here for follow-up of elevated blood pressure. She is not exercising and is adherent to low salt diet sometimes.  Blood pressure is not checked at home Cardiac symptoms none. Patient denies chest pain, chest pressure/discomfort, dyspnea, irregular heart beat, lower extremity edema, orthopnea and palpitations.  Cardiovascular risk factors: hypertension, obesity (BMI >= 30 kg/m2) and sedentary lifestyle. Use of agents associated with hypertension: decongestants. History of target organ damage: none. BP Readings from Last 3 Encounters:  01/28/17 140/90  02/27/16 126/78  02/28/15 114/80   Wt Readings from Last 3 Encounters:  01/28/17 286 lb 12.8 oz (130.1 kg)  02/27/16 283 lb 9.6 oz (128.6 kg)  02/28/15 296 lb (134.3 kg)  Body mass index is 47.73 kg/m.   Morbid obesity She has a family history of diabetes In her grandparents She denies polyuria, polydipsia, polyphagia She denies fluctuating weight changes Lab Results  Component Value Date   HGBA1C 5.6 11/10/2012   She has a [employer] membership but does not go.  She does not have an accountability buddy but thinks she could find someone.     Past Medical History:  Diagnosis Date  . Heart murmur   . Hypertension   . Infertility, female   . Obstructed fallopian tubes    HX OF  LEFT FALLOPIAN TUBE.....     Current Outpatient Medications  Medication Sig Dispense Refill  . albuterol (PROVENTIL HFA;VENTOLIN HFA) 108 (90 Base) MCG/ACT inhaler Inhale 2 puffs into the lungs every 6 (six) hours as needed for wheezing or shortness of breath. 1 Inhaler 0  . hydrochlorothiazide (MICROZIDE) 12.5 MG capsule Take 1 capsule (12.5 mg total) by mouth daily. 90 capsule 0   No current facility-administered medications for this visit.     Allergies: Not on File  Past Surgical  History:  Procedure Laterality Date  . DILATION AND EVACUATION  01/27/2008  . LAPAROSCOPIC CHOLECYSTECTOMY  1992    Social History   Socioeconomic History  . Marital status: Married    Spouse name: None  . Number of children: None  . Years of education: None  . Highest education level: None  Social Needs  . Financial resource strain: None  . Food insecurity - worry: None  . Food insecurity - inability: None  . Transportation needs - medical: None  . Transportation needs - non-medical: None  Occupational History  . None  Tobacco Use  . Smoking status: Former Smoker    Last attempt to quit: 03/26/1994    Years since quitting: 22.8  . Smokeless tobacco: Never Used  Substance and Sexual Activity  . Alcohol use: No    Alcohol/week: 0.0 oz  . Drug use: No  . Sexual activity: Yes    Birth control/protection: None  Other Topics Concern  . None  Social History Narrative  . None    Family History  Problem Relation Age of Onset  . Diabetes Mother   . Hypertension Mother   . Cancer Mother        LIVER - 34  . Breast cancer Maternal Grandmother   . Diabetes Maternal Grandfather   . Diabetes Sister   . Cancer Paternal Grandmother   . Cirrhosis Paternal Grandfather      ROS Review of Systems See HPI Constitution: 2 weeks ago she had fevers but they resolved No malaise No  diaphoresis HEENT: +nasal congestion, +cough, no wheezing Skin: No rash or itching Eyes: no blurry vision, no double vision GU: no dysuria or hematuria Neuro: no dizziness or headaches all others reviewed and negative   Objective: Vitals:   01/28/17 0921  BP: 140/90  Pulse: (!) 101  Resp: 17  Temp: 98.6 F (37 C)  TempSrc: Oral  SpO2: 97%  Weight: 286 lb 12.8 oz (130.1 kg)  Height: 5\' 5"  (1.651 m)    Physical Exam  Constitutional: She is oriented to person, place, and time. She appears well-developed and well-nourished.  HENT:  Head: Normocephalic and atraumatic.  Eyes: Conjunctivae  and EOM are normal.  Cardiovascular: Normal rate, regular rhythm and normal heart sounds.  No murmur heard. Pulmonary/Chest: Effort normal and breath sounds normal. No stridor. No respiratory distress.  Neurological: She is alert and oriented to person, place, and time.  Skin: Skin is warm. Capillary refill takes less than 2 seconds.  Psychiatric: She has a normal mood and affect. Her behavior is normal. Judgment and thought content normal.     Assessment and Plan [name] was seen today for new patient (initial visit).  [name] was seen today for new patient (initial visit).  Diagnoses and all orders for this visit:  Essential hypertension- advise resuming hctz Discussed follow up in one month to check K and follow up on bp Pt only had 16 ounces of water across the past two days -     Comprehensive metabolic panel -     Microalbumin, urine -     hydrochlorothiazide (MICROZIDE) 12.5 MG capsule; Take 1 capsule (12.5 mg total) by mouth daily. -     TSH  Morbid obesity ([place])- discussed diet and exercise Advised 10 minutes a day every day for exercise and once successful can increase from there Discussed that out of the 1440 minutes in a day 10 minutes is less than 1% Also finding an accountability buddy -     Lipid panel -     Hemoglobin A1c -     Comprehensive metabolic panel -     TSH  Screening, lipid- discussed risk factor modification -     Lipid panel  Family history of diabetes mellitus (DM) -     Hemoglobin A1c  Screening for diabetes mellitus -     Hemoglobin A1c  Screening for thyroid disorder -     TSH  URI with cough and congestion- albuterol prn  Impacted cerumen of both ears- advised debrox and gave instructions for use  Other orders -     Cancel: MM Digital Screening; Future -     albuterol (PROVENTIL HFA;VENTOLIN HFA) 108 (90 Base) MCG/ACT inhaler; Inhale 2 puffs into the lungs every 6 (six) hours as needed for wheezing or shortness of breath. -      carbamide peroxide (DEBROX) 6.5 % OTIC solution; Place 5 drops into both ears 2 (two) times daily.   A total of 25 minutes were spent face-to-face with the patient during this encounter and over half of that time was spent on counseling and coordination of care.     [place]

## 2017-01-29 LAB — COMPREHENSIVE METABOLIC PANEL
ALK PHOS: 66 IU/L (ref 39–117)
ALT: 18 IU/L (ref 0–32)
AST: 15 IU/L (ref 0–40)
Albumin/Globulin Ratio: 1.3 (ref 1.2–2.2)
Albumin: 4.1 g/dL (ref 3.5–5.5)
BUN/Creatinine Ratio: 9 (ref 9–23)
BUN: 7 mg/dL (ref 6–24)
Bilirubin Total: 0.3 mg/dL (ref 0.0–1.2)
CO2: 21 mmol/L (ref 20–29)
CREATININE: 0.8 mg/dL (ref 0.57–1.00)
Calcium: 9.3 mg/dL (ref 8.7–10.2)
Chloride: 104 mmol/L (ref 96–106)
GFR calc Af Amer: 104 mL/min/{1.73_m2} (ref >59)
GFR calc non Af Amer: 90 mL/min/{1.73_m2} (ref >59)
GLOBULIN, TOTAL: 3.1 g/dL (ref 1.5–4.5)
GLUCOSE: 104 mg/dL — AB (ref 65–99)
POTASSIUM: 4.2 mmol/L (ref 3.5–5.2)
SODIUM: 140 mmol/L (ref 134–144)
Total Protein: 7.2 g/dL (ref 6.0–8.5)

## 2017-01-29 LAB — HEMOGLOBIN A1C
ESTIMATED AVERAGE GLUCOSE: 120 mg/dL
HEMOGLOBIN A1C: 5.8 % — AB (ref 4.8–5.6)

## 2017-01-29 LAB — LIPID PANEL
CHOL/HDL RATIO: 5.2 ratio — AB (ref 0.0–4.4)
Cholesterol, Total: 204 mg/dL — ABNORMAL HIGH (ref 100–199)
HDL: 39 mg/dL — AB (ref >39)
LDL Calculated: 143 mg/dL — ABNORMAL HIGH (ref 0–99)
Triglycerides: 109 mg/dL (ref 0–149)
VLDL Cholesterol Cal: 22 mg/dL (ref 5–40)

## 2017-01-29 LAB — TSH: TSH: 0.649 u[IU]/mL (ref 0.450–4.500)

## 2017-01-29 LAB — MICROALBUMIN, URINE: MICROALBUM., U, RANDOM: 4.7 ug/mL

## 2017-02-18 ENCOUNTER — Ambulatory Visit
Admission: RE | Admit: 2017-02-18 | Discharge: 2017-02-18 | Disposition: A | Discharge status: Home / Self Care | Payer: PRIVATE HEALTH INSURANCE | Source: Ambulatory Visit | Attending: Family Medicine | Admitting: Family Medicine

## 2017-02-18 DIAGNOSIS — Z1231 Encounter for screening mammogram for malignant neoplasm of breast: Secondary | ICD-10-CM

## 2017-03-02 ENCOUNTER — Encounter: Payer: Self-pay | Admitting: Family Medicine

## 2017-03-02 ENCOUNTER — Ambulatory Visit (INDEPENDENT_AMBULATORY_CARE_PROVIDER_SITE_OTHER): Payer: PRIVATE HEALTH INSURANCE | Admitting: Family Medicine

## 2017-03-02 ENCOUNTER — Other Ambulatory Visit: Payer: Self-pay

## 2017-03-02 VITALS — BP 143/88 | HR 102 | Temp 99.5°F | Resp 17 | Ht 65.5 in | Wt 288.8 lb

## 2017-03-02 DIAGNOSIS — I10 Essential (primary) hypertension: Secondary | ICD-10-CM | POA: Diagnosis not present

## 2017-03-02 DIAGNOSIS — Z5181 Encounter for therapeutic drug level monitoring: Secondary | ICD-10-CM

## 2017-03-02 DIAGNOSIS — R7303 Prediabetes: Secondary | ICD-10-CM | POA: Insufficient documentation

## 2017-03-02 DIAGNOSIS — E785 Hyperlipidemia, unspecified: Secondary | ICD-10-CM | POA: Diagnosis not present

## 2017-03-02 MED ORDER — OMEGA-3 FISH OIL 1000 MG PO CAPS: 1.0000 | ORAL_CAPSULE | Freq: Every day | ORAL | 3 refills | Status: AC

## 2017-03-02 MED ORDER — METFORMIN HCL 500 MG PO TABS: 500.0000 mg | ORAL_TABLET | Freq: Every day | ORAL | 0 refills | Status: AC

## 2017-03-02 MED ORDER — RED YEAST RICE 600 MG PO CAPS: ORAL_CAPSULE | ORAL | 3 refills | Status: AC

## 2017-03-02 MED ORDER — HYDROCHLOROTHIAZIDE 25 MG PO TABS: 25.0000 mg | ORAL_TABLET | Freq: Every day | ORAL | 1 refills | Status: AC

## 2017-03-02 NOTE — Progress Notes (Signed)
Chief Complaint  Patient presents with  . Follow-up    essential hypertension    HPI   Hypertension: Patient here for follow-up of hypertension. Last bp visit was 01/28/17. She is not exercising and is not adherent to low salt diet.  Blood pressure is not well controlled at home. Cardiac symptoms lower extremity edema. Patient denies chest pain, chest pressure/discomfort, claudication, dyspnea, exertional chest pressure/discomfort, fatigue, irregular heart beat, near-syncope, orthopnea, palpitations, paroxysmal nocturnal dyspnea and syncope.  Cardiovascular risk factors: hypertension and obesity (BMI >= 30 kg/m2). Use of agents associated with hypertension: none. History of target organ damage: none.  She is not going to [facility] [employer] but plans on getting into the gym.   BP Readings from Last 3 Encounters:  03/02/17 (!) 143/88  01/28/17 140/90  02/27/16 126/78    Prediabetes Lab Results  Component Value Date   HGBA1C 5.8 (H) 01/28/2017   Exercise- none this month Weight loss- none so far, weight gained Family history of diabetes - she has a family history on her mother's side with her MGF with diabetes  Wt Readings from Last 3 Encounters:  03/02/17 288 lb 12.8 oz (131 kg)  01/28/17 286 lb 12.8 oz (130.1 kg)  02/27/16 283 lb 9.6 oz (128.6 kg)    Dyslipidemia: Patient presents for evaluation of lipids. She had labs done in December 2018. Her levels are high and she is not currently being treated. She is not exercising at this time. She reports that she was eating a lot more over the holidays. A repeat fasting lipid profile was not done.  The patient does use medications that may worsen dyslipidemias (corticosteroids, progestins, anabolic steroids, diuretics, beta-blockers, amiodarone, cyclosporine, olanzapine). The patient is not exercising.  The patient is not known to have coexisting coronary artery disease.   The 10-year ASCVD risk score Mikey Bussing DC Brooke Bonito., et al., 2013) is: 7.2%   Values used to calculate the score:     Age: 43 years     Sex: Female     Is Non-Hispanic African American: Yes     Diabetic: No     Tobacco smoker: No     Systolic Blood Pressure: 086 mmHg     Is BP treated: Yes     HDL Cholesterol: 39 mg/dL     Total Cholesterol: 204 mg/dL   Lab Results  Component Value Date   CHOL 204 (H) 01/28/2017   CHOL 183 08/26/2011   CHOL 169 03/27/2011   Lab Results  Component Value Date   HDL 39 (L) 01/28/2017   HDL 41 08/26/2011   Lab Results  Component Value Date   LDLCALC 143 (H) 01/28/2017   LDLCALC 128 (H) 08/26/2011   Lab Results  Component Value Date   TRIG 109 01/28/2017   TRIG 71 08/26/2011   Lab Results  Component Value Date   CHOLHDL 5.2 (H) 01/28/2017   CHOLHDL 4.5 08/26/2011   No results found for: LDLDIRECT   Past Medical History:  Diagnosis Date  . Heart murmur   . Hypertension   . Infertility, female   . Obstructed fallopian tubes    HX OF  LEFT FALLOPIAN TUBE.....     Current Outpatient Medications  Medication Sig Dispense Refill  . albuterol (PROVENTIL HFA;VENTOLIN HFA) 108 (90 Base) MCG/ACT inhaler Inhale 2 puffs into the lungs every 6 (six) hours as needed for wheezing or shortness of breath. 1 Inhaler 0  . carbamide peroxide (DEBROX) 6.5 % OTIC solution Place 5 drops into  both ears 2 (two) times daily. 15 mL 0  . hydrochlorothiazide (HYDRODIURIL) 25 MG tablet Take 1 tablet (25 mg total) by mouth daily. 90 tablet 1  . metFORMIN (GLUCOPHAGE) 500 MG tablet Take 1 tablet (500 mg total) by mouth daily with breakfast. 90 tablet 0  . omega-3 fish oil (MAXEPA) 1000 MG CAPS capsule Take 1 capsule (1,000 mg total) by mouth daily. 90 each 3  . Red Yeast Rice 600 MG CAPS Take one capsule daily by mouth 90 capsule 3   No current facility-administered medications for this visit.     Allergies: No Known Allergies  Past Surgical History:  Procedure Laterality Date  . DILATION AND EVACUATION  01/27/2008  . LAPAROSCOPIC  CHOLECYSTECTOMY  1992    Social History   Socioeconomic History  . Marital status: Married    Spouse name: None  . Number of children: None  . Years of education: None  . Highest education level: None  Social Needs  . Financial resource strain: None  . Food insecurity - worry: None  . Food insecurity - inability: None  . Transportation needs - medical: None  . Transportation needs - non-medical: None  Occupational History  . None  Tobacco Use  . Smoking status: Former Smoker    Last attempt to quit: 03/26/1994    Years since quitting: 22.9  . Smokeless tobacco: Never Used  Substance and Sexual Activity  . Alcohol use: No    Alcohol/week: 0.0 oz  . Drug use: No  . Sexual activity: Yes    Birth control/protection: None  Other Topics Concern  . None  Social History Narrative  . None    Family History  Problem Relation Age of Onset  . Diabetes Mother   . Hypertension Mother   . Cancer Mother        LIVER - 51  . Breast cancer Maternal Grandmother 40  . Diabetes Maternal Grandfather   . Diabetes Sister   . Cancer Paternal Grandmother   . Cirrhosis Paternal Grandfather      ROS Review of Systems See HPI Constitution: No fevers or chills No malaise No diaphoresis Skin: No rash or itching Eyes: no blurry vision, no double vision GU: no dysuria or hematuria Neuro: no dizziness or headaches all others reviewed and negative   Objective: Vitals:   03/02/17 0859  BP: (!) 143/88  Pulse: (!) 102  Resp: 17  Temp: 99.5 F (37.5 C)  TempSrc: Oral  SpO2: 98%  Weight: 288 lb 12.8 oz (131 kg)  Height: 5' 5.5" (1.664 m)   Body mass index is 47.33 kg/m.   Physical Exam  Constitutional: She is oriented to person, place, and time. She appears well-developed and well-nourished.  HENT:  Head: Normocephalic and atraumatic.  Eyes: Conjunctivae and EOM are normal.  Cardiovascular: Normal rate, regular rhythm and normal heart sounds.  No murmur  heard. Pulmonary/Chest: Effort normal and breath sounds normal. No stridor. No respiratory distress.  Neurological: She is alert and oriented to person, place, and time.  Skin: Skin is warm. Capillary refill takes less than 2 seconds.  Psychiatric: She has a normal mood and affect. Her behavior is normal. Judgment and thought content normal.    Assessment and Plan Islam was seen today for follow-up.  Diagnoses and all orders for this visit:  Encounter for medication monitoring  Other orders -     hydrochlorothiazide (HYDRODIURIL) 25 MG tablet; Take 1 tablet (25 mg total) by mouth daily. -  Red Yeast Rice 600 MG CAPS; Take one capsule daily by mouth -     omega-3 fish oil (MAXEPA) 1000 MG CAPS capsule; Take 1 capsule (1,000 mg total) by mouth daily.  Problem List Items Addressed This Visit      Cardiovascular and Mediastinum   HTN (hypertension) - Primary    bp not improved to goal, will increase hctz      Relevant Medications   hydrochlorothiazide (HYDRODIURIL) 25 MG tablet     Other   Morbid obesity ([place])    Discussed benefit of regular exercise for 10 minutes a day every day  Also starting metformin with her exercise and diet program       Relevant Medications   metFORMIN (GLUCOPHAGE) 500 MG tablet   Prediabetes    Will start metformin to help prevent diabetes and aid weight loss Lab Results  Component Value Date   CREATININE 0.80 01/28/2017         Relevant Medications   metFORMIN (GLUCOPHAGE) 500 MG tablet   Dyslipidemia    Started fish oil and red yeast rice supplementation Discussed dietary changes, exercise and weight loss       Other Visit Diagnoses    Encounter for medication monitoring            Garett Tetzloff [employer]

## 2017-03-02 NOTE — Assessment & Plan Note (Signed)
bp not improved to goal, will increase hctz

## 2017-03-02 NOTE — Assessment & Plan Note (Signed)
Will start metformin to help prevent diabetes and aid weight loss Lab Results  Component Value Date   CREATININE 0.80 01/28/2017

## 2017-03-02 NOTE — Assessment & Plan Note (Signed)
Discussed benefit of regular exercise for 10 minutes a day every day  Also starting metformin with her exercise and diet program

## 2017-03-02 NOTE — Assessment & Plan Note (Signed)
Started fish oil and red yeast rice supplementation Discussed dietary changes, exercise and weight loss

## 2017-03-02 NOTE — Patient Instructions (Addendum)
   IF you received an x-ray today, you will receive an invoice from [facility]. Please contact [facility] at [phone] with questions or concerns regarding your invoice.   IF you received labwork today, you will receive an invoice from [facility]. Please contact [facility] at [phone] with questions or concerns regarding your invoice.   Our billing staff will not be able to assist you with questions regarding bills from these companies.  You will be contacted with the lab results as soon as they are available. The fastest way to get your results is to activate your My Chart account. Instructions are located on the last page of this paperwork. If you have not heard from us regarding the results in 2 weeks, please contact this office.    Prediabetes Eating Plan Prediabetes-also called impaired glucose tolerance or impaired fasting glucose-is a condition that causes blood sugar (blood glucose) levels to be higher than normal. Following a healthy diet can help to keep prediabetes under control. It can also help to lower the risk of type 2 diabetes and heart disease, which are increased in people who have prediabetes. Along with regular exercise, a healthy diet:  Promotes weight loss.  Helps to control blood sugar levels.  Helps to improve the way that the body uses insulin.  What do I need to know about this eating plan?  Use the glycemic index (GI) to plan your meals. The index tells you how quickly a food will raise your blood sugar. Choose low-GI foods. These foods take a longer time to raise blood sugar.  Pay close attention to the amount of carbohydrates in the food that you eat. Carbohydrates increase blood sugar levels.  Keep track of how many calories you take in. Eating the right amount of calories will help you to achieve a healthy weight. Losing about 7 percent of your starting weight can help to prevent type 2 diabetes.  You may want to follow a  Mediterranean diet. This diet includes a lot of vegetables, lean meats or fish, whole grains, fruits, and healthy oils and fats. What foods can I eat? Grains Whole grains, such as whole-wheat or whole-grain breads, crackers, cereals, and pasta. Unsweetened oatmeal. Bulgur. Barley. Quinoa. Brown rice. Corn or whole-wheat flour tortillas or taco shells. Vegetables Lettuce. Spinach. Peas. Beets. Cauliflower. Cabbage. Broccoli. Carrots. Tomatoes. Squash. Eggplant. Herbs. Peppers. Onions. Cucumbers. Brussels sprouts. Fruits Berries. Bananas. Apples. Oranges. Grapes. Papaya. Mango. Pomegranate. Kiwi. Grapefruit. Cherries. Meats and Other Protein Sources Seafood. Lean meats, such as chicken and turkey or lean cuts of pork and beef. Tofu. Eggs. Nuts. Beans. Dairy Low-fat or fat-free dairy products, such as yogurt, cottage cheese, and cheese. Beverages Water. Tea. Coffee. Sugar-free or diet soda. Seltzer water. Milk. Milk alternatives, such as soy or almond milk. Condiments Mustard. Relish. Low-fat, low-sugar ketchup. Low-fat, low-sugar barbecue sauce. Low-fat or fat-free mayonnaise. Sweets and Desserts Sugar-free or low-fat pudding. Sugar-free or low-fat ice cream and other frozen treats. Fats and Oils Avocado. Walnuts. Olive oil. The items listed above may not be a complete list of recommended foods or beverages. Contact your dietitian for more options. What foods are not recommended? Grains Refined white flour and flour products, such as bread, pasta, snack foods, and cereals. Beverages Sweetened drinks, such as sweet iced tea and soda. Sweets and Desserts Baked goods, such as cake, cupcakes, pastries, cookies, and cheesecake. The items listed above may not be a complete list of foods and beverages to avoid. Contact your dietitian for more information.   This information is not intended to replace advice given to you by your health care provider. Make sure you discuss any questions you have with  your health care provider. Document Released: 06/27/2014 Document Revised: 07/19/2015 Document Reviewed: 03/08/2014 Elsevier Interactive Patient Education  2017 [employer]  

## 2017-04-02 ENCOUNTER — Other Ambulatory Visit: Payer: Self-pay | Admitting: Family Medicine

## 2017-04-02 DIAGNOSIS — I10 Essential (primary) hypertension: Secondary | ICD-10-CM

## 2017-05-07 ENCOUNTER — Other Ambulatory Visit: Payer: Self-pay | Admitting: Family Medicine

## 2017-05-07 DIAGNOSIS — R7303 Prediabetes: Secondary | ICD-10-CM

## 2017-05-07 NOTE — Telephone Encounter (Signed)
Metformin refill Last OV: 03/02/17 Last Refill:03/02/17 #90 (pt has f/u 06/01/17) Pharmacy:[facility] [id number] PCP: Dr Delia Chimes Refill denied pt has enough med until f/u

## 2017-06-01 ENCOUNTER — Ambulatory Visit: Payer: PRIVATE HEALTH INSURANCE | Admitting: Family Medicine

## 2017-06-19 ENCOUNTER — Other Ambulatory Visit: Payer: Self-pay | Admitting: Family Medicine

## 2017-06-19 DIAGNOSIS — I10 Essential (primary) hypertension: Secondary | ICD-10-CM

## 2017-07-08 ENCOUNTER — Other Ambulatory Visit: Payer: Self-pay | Admitting: Family Medicine

## 2017-07-08 DIAGNOSIS — I10 Essential (primary) hypertension: Secondary | ICD-10-CM

## 2017-07-09 ENCOUNTER — Other Ambulatory Visit: Payer: Self-pay | Admitting: Family Medicine

## 2017-07-09 DIAGNOSIS — I10 Essential (primary) hypertension: Secondary | ICD-10-CM

## 2017-09-30 ENCOUNTER — Other Ambulatory Visit: Payer: Self-pay | Admitting: Family Medicine

## 2019-03-08 ENCOUNTER — Other Ambulatory Visit: Payer: Self-pay | Admitting: Family Medicine

## 2019-03-08 DIAGNOSIS — Z1231 Encounter for screening mammogram for malignant neoplasm of breast: Secondary | ICD-10-CM

## 2019-03-09 ENCOUNTER — Other Ambulatory Visit: Payer: Self-pay

## 2019-03-09 ENCOUNTER — Ambulatory Visit
Admission: RE | Admit: 2019-03-09 | Discharge: 2019-03-09 | Disposition: A | Discharge status: Home / Self Care | Payer: PRIVATE HEALTH INSURANCE | Source: Ambulatory Visit | Attending: Family Medicine | Admitting: Family Medicine

## 2019-03-09 DIAGNOSIS — Z1231 Encounter for screening mammogram for malignant neoplasm of breast: Secondary | ICD-10-CM

## 2020-04-20 ENCOUNTER — Other Ambulatory Visit: Payer: Self-pay | Admitting: Family Medicine

## 2020-06-20 ENCOUNTER — Other Ambulatory Visit: Payer: Self-pay | Admitting: Family Medicine

## 2020-06-20 DIAGNOSIS — Z1231 Encounter for screening mammogram for malignant neoplasm of breast: Secondary | ICD-10-CM

## 2020-08-10 ENCOUNTER — Ambulatory Visit: Payer: PRIVATE HEALTH INSURANCE

## 2020-09-17 ENCOUNTER — Other Ambulatory Visit: Payer: Self-pay

## 2020-09-17 ENCOUNTER — Ambulatory Visit
Admission: RE | Admit: 2020-09-17 | Discharge: 2020-09-17 | Disposition: A | Discharge status: Home / Self Care | Payer: No Typology Code available for payment source | Source: Ambulatory Visit | Attending: Family Medicine | Admitting: Family Medicine

## 2020-09-17 DIAGNOSIS — Z1231 Encounter for screening mammogram for malignant neoplasm of breast: Secondary | ICD-10-CM

## 2021-03-15 ENCOUNTER — Other Ambulatory Visit: Payer: Self-pay | Admitting: Family Medicine

## 2021-03-15 ENCOUNTER — Other Ambulatory Visit (HOSPITAL_COMMUNITY)
Admission: RE | Admit: 2021-03-15 | Discharge: 2021-03-15 | Disposition: A | Discharge status: Home / Self Care | Payer: PRIVATE HEALTH INSURANCE | Source: Ambulatory Visit | Attending: Family Medicine | Admitting: Family Medicine

## 2021-03-15 DIAGNOSIS — Z124 Encounter for screening for malignant neoplasm of cervix: Secondary | ICD-10-CM | POA: Diagnosis present

## 2021-03-18 LAB — CYTOLOGY - PAP
Adequacy: ABSENT
Comment: NEGATIVE
Diagnosis: NEGATIVE
High risk HPV: NEGATIVE

## 2021-09-05 ENCOUNTER — Other Ambulatory Visit: Payer: Self-pay | Admitting: Family Medicine

## 2021-09-05 DIAGNOSIS — Z1231 Encounter for screening mammogram for malignant neoplasm of breast: Secondary | ICD-10-CM

## 2021-09-19 ENCOUNTER — Ambulatory Visit
Admission: RE | Admit: 2021-09-19 | Discharge: 2021-09-19 | Disposition: A | Discharge status: Home / Self Care | Payer: PRIVATE HEALTH INSURANCE | Source: Ambulatory Visit | Attending: Family Medicine | Admitting: Family Medicine

## 2021-09-19 DIAGNOSIS — Z1231 Encounter for screening mammogram for malignant neoplasm of breast: Secondary | ICD-10-CM

## 2022-09-11 ENCOUNTER — Other Ambulatory Visit: Payer: Self-pay | Admitting: Family Medicine

## 2022-09-11 DIAGNOSIS — Z1231 Encounter for screening mammogram for malignant neoplasm of breast: Secondary | ICD-10-CM

## 2022-09-26 ENCOUNTER — Ambulatory Visit
Admission: RE | Admit: 2022-09-26 | Discharge: 2022-09-26 | Disposition: A | Discharge status: Home / Self Care | Payer: PRIVATE HEALTH INSURANCE | Source: Ambulatory Visit | Attending: Family Medicine | Admitting: Family Medicine

## 2022-09-26 DIAGNOSIS — Z1231 Encounter for screening mammogram for malignant neoplasm of breast: Secondary | ICD-10-CM

## 2023-02-02 IMAGING — MG MM DIGITAL SCREENING BILAT W/ TOMO AND CAD
8 series · 8 of 24 positions shown · non-contrast
Comparison: Previous exam(s).

CLINICAL DATA: Screening.

EXAM:
DIGITAL SCREENING BILATERAL MAMMOGRAM WITH TOMOSYNTHESIS AND CAD
TECHNIQUE: Bilateral screening digital craniocaudal and mediolateral oblique
mammograms were obtained. Bilateral screening digital breast
tomosynthesis was performed. The images were evaluated with
computer-aided detection.

[L MLO synth-2D]
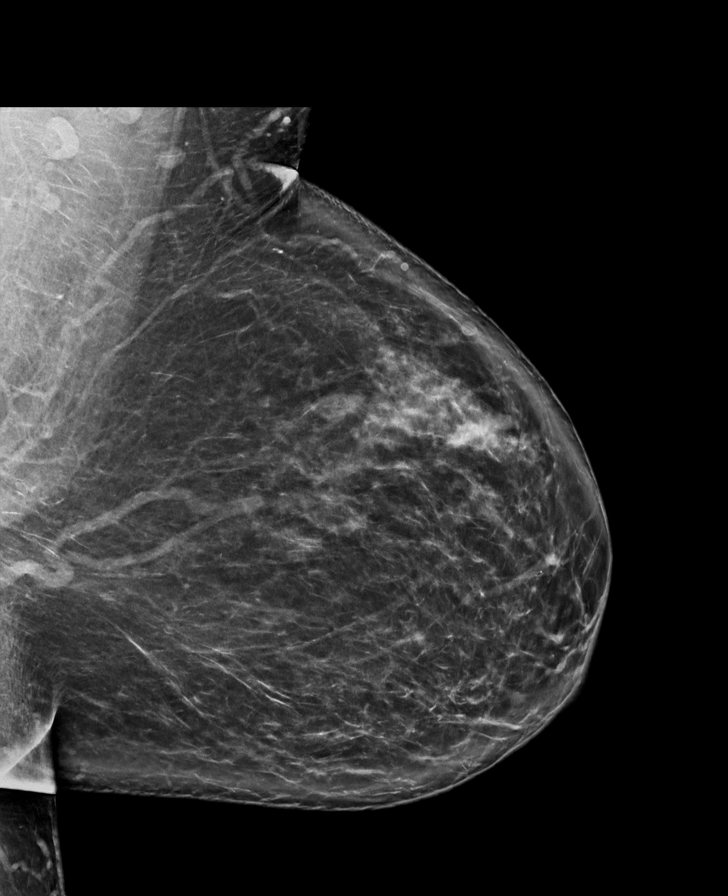

[R MLO synth-2D]
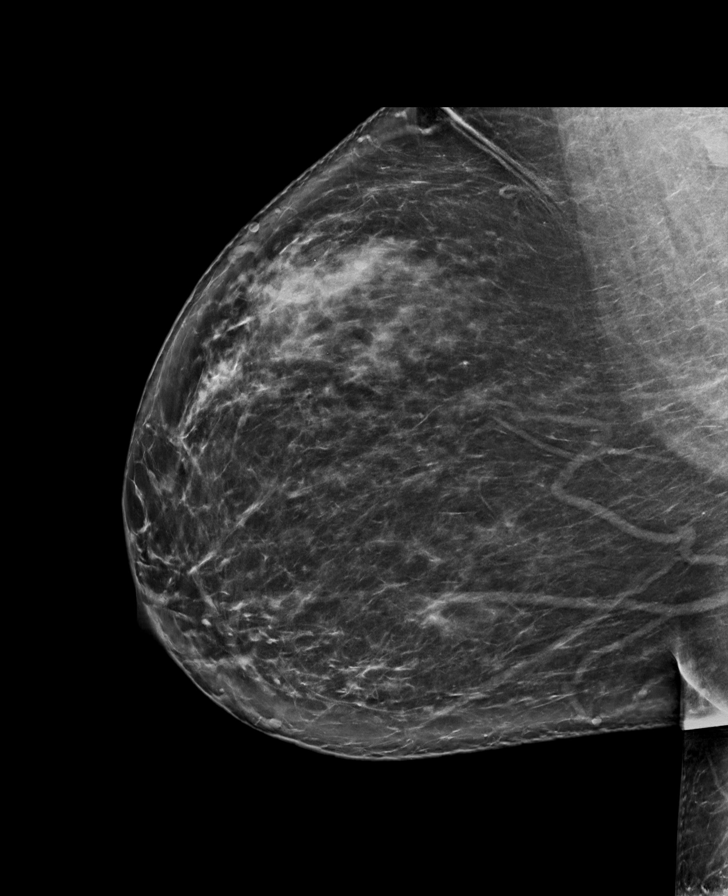

[R CC synth-2D]
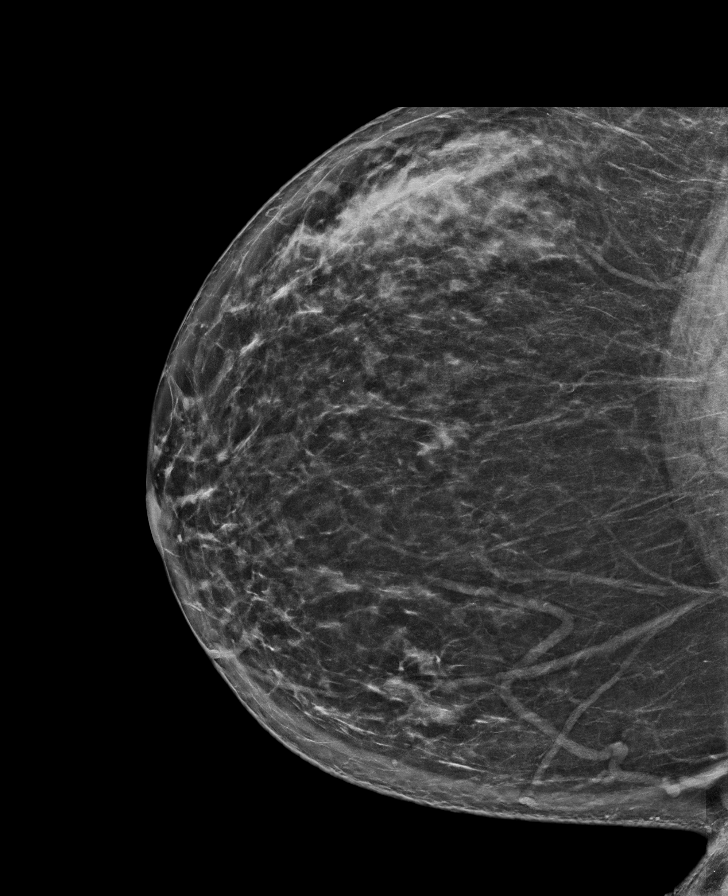

[L CC synth-2D]
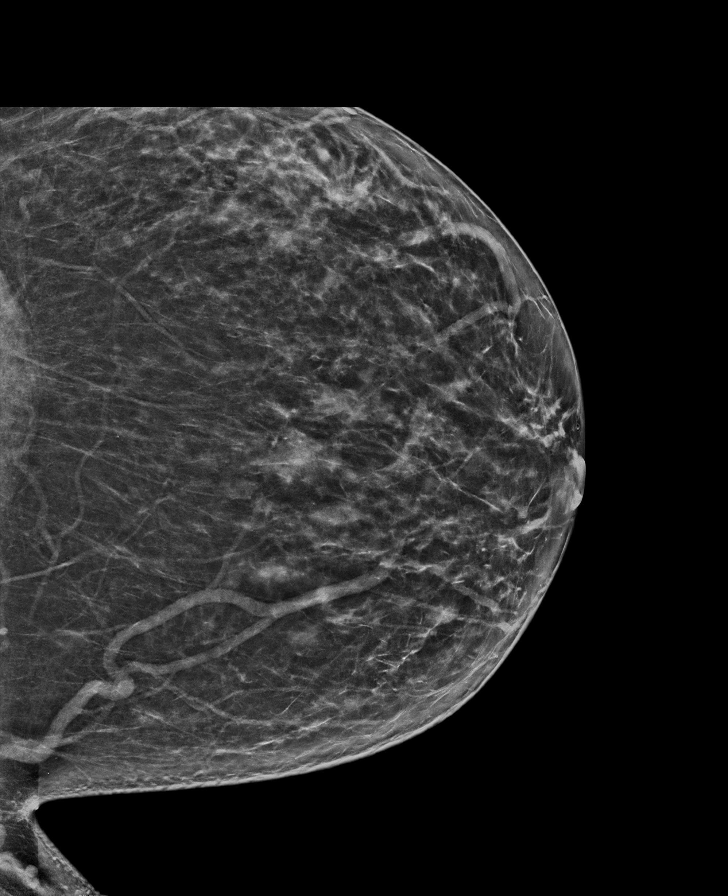

[R MLO tomo · tomo slice 48/95.0]
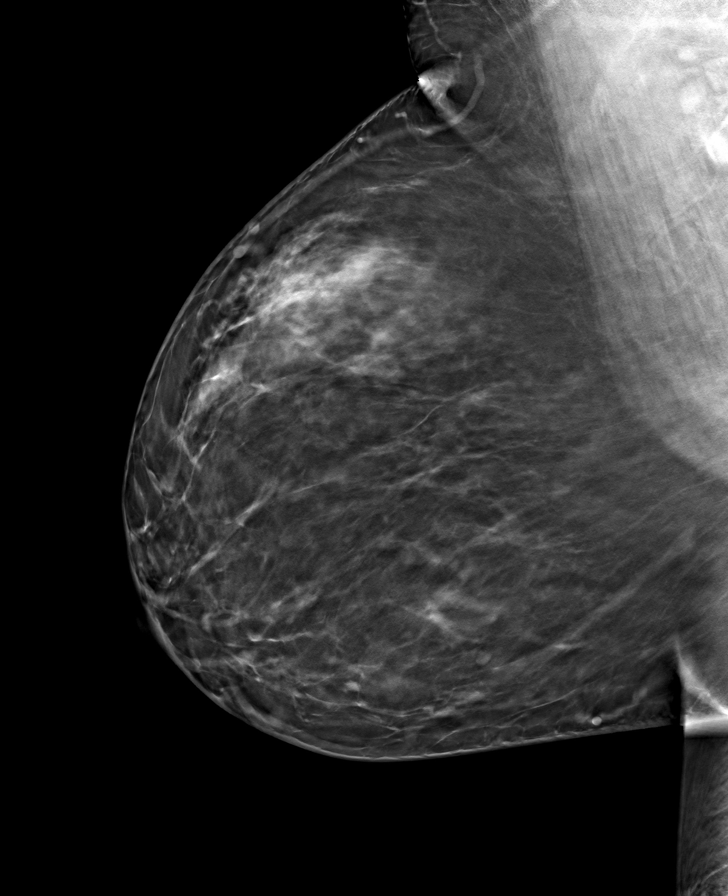

[L CC tomo · tomo slice 38/75.0]
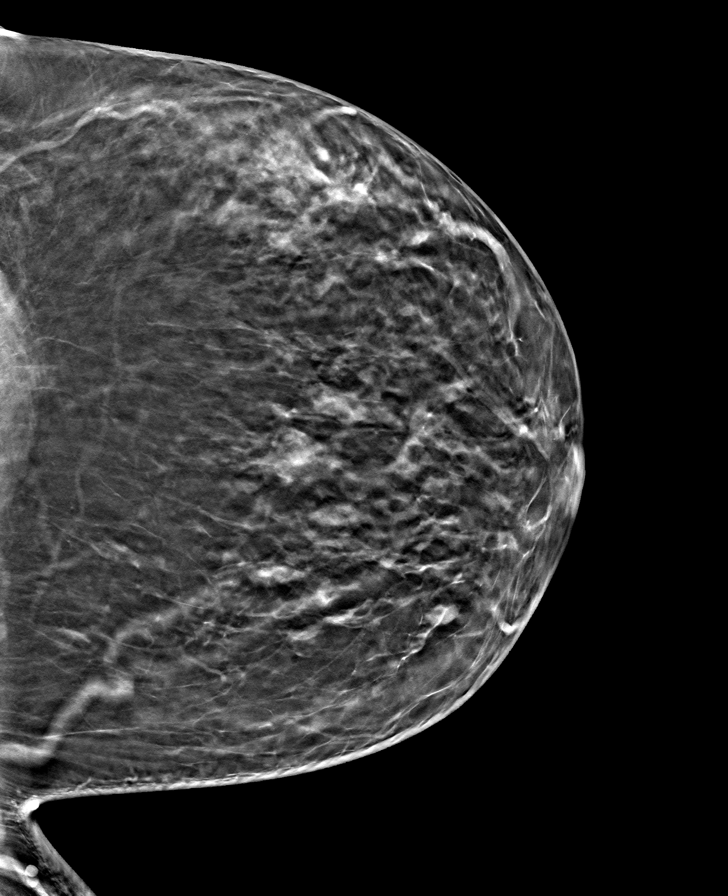

[L MLO tomo · tomo slice 50/99.0]
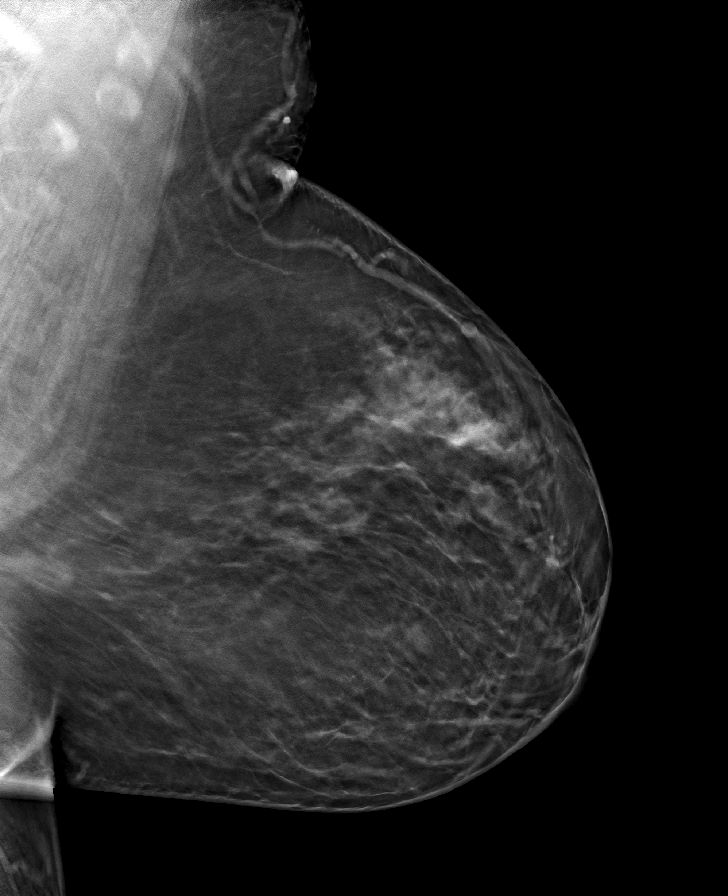

[R CC tomo · tomo slice 40/79.0]
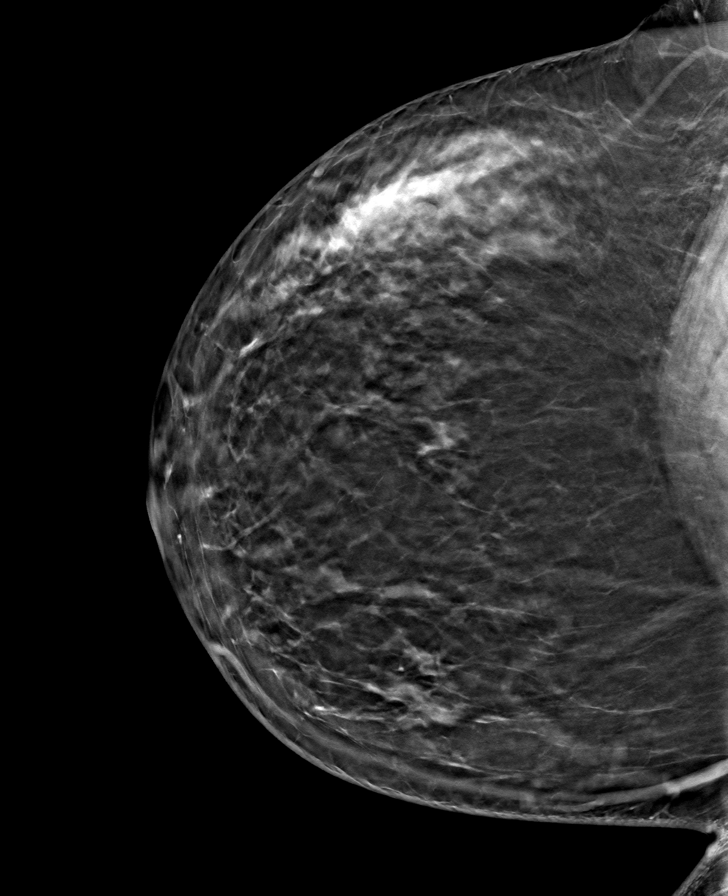

[8 of 24 positions shown; findings below may reference images not displayed]

ACR Breast Density Category c: The breast tissue is heterogeneously
dense, which may obscure small masses.
FINDINGS: There are no findings suspicious for malignancy.
IMPRESSION: No mammographic evidence of malignancy. A result letter of this
screening mammogram will be mailed directly to the patient.

RECOMMENDATION:
Screening mammogram in one year. (Code:Q3-W-BC3)

BI-RADS CATEGORY  1: Negative.

## 2023-05-04 ENCOUNTER — Other Ambulatory Visit (HOSPITAL_COMMUNITY): Payer: Self-pay | Admitting: Family Medicine

## 2023-05-04 DIAGNOSIS — E785 Hyperlipidemia, unspecified: Secondary | ICD-10-CM

## 2023-05-14 ENCOUNTER — Ambulatory Visit (HOSPITAL_COMMUNITY)
Admission: RE | Admit: 2023-05-14 | Discharge: 2023-05-14 | Disposition: A | Discharge status: Home / Self Care | Payer: Self-pay | Source: Ambulatory Visit | Attending: Family Medicine | Admitting: Family Medicine

## 2023-05-14 DIAGNOSIS — E785 Hyperlipidemia, unspecified: Secondary | ICD-10-CM | POA: Insufficient documentation

## 2023-10-14 ENCOUNTER — Other Ambulatory Visit: Payer: Self-pay | Admitting: Family Medicine

## 2023-10-14 DIAGNOSIS — Z1231 Encounter for screening mammogram for malignant neoplasm of breast: Secondary | ICD-10-CM

## 2023-10-29 ENCOUNTER — Ambulatory Visit
Admission: RE | Admit: 2023-10-29 | Discharge: 2023-10-29 | Disposition: A | Discharge status: Home / Self Care | Payer: PRIVATE HEALTH INSURANCE | Source: Ambulatory Visit | Attending: Family Medicine | Admitting: Family Medicine

## 2023-10-29 DIAGNOSIS — Z1231 Encounter for screening mammogram for malignant neoplasm of breast: Secondary | ICD-10-CM
